# Patient Record
Sex: Female | Born: 1960 | Hispanic: No | Marital: Married | Smoking: Never smoker
Health system: Southern US, Community
[De-identification: ages and names within clinical notes are randomized; demographics above are authoritative.]

## PROBLEM LIST (undated history)

## (undated) DIAGNOSIS — R569 Unspecified convulsions: Secondary | ICD-10-CM

## (undated) DIAGNOSIS — F419 Anxiety disorder, unspecified: Secondary | ICD-10-CM

## (undated) DIAGNOSIS — I10 Essential (primary) hypertension: Secondary | ICD-10-CM

## (undated) HISTORY — PX: APPENDECTOMY: SHX54

## (undated) HISTORY — PX: ABDOMINAL HYSTERECTOMY: SHX81

## (undated) HISTORY — PX: CHOLECYSTECTOMY: SHX55

## (undated) HISTORY — PX: OTHER SURGICAL HISTORY: SHX169

---

## 2005-03-15 ENCOUNTER — Emergency Department: Payer: Self-pay | Admitting: Unknown Physician Specialty

## 2006-07-05 ENCOUNTER — Ambulatory Visit: Payer: Self-pay

## 2007-08-16 ENCOUNTER — Ambulatory Visit: Payer: Self-pay

## 2008-12-02 ENCOUNTER — Ambulatory Visit: Payer: Self-pay

## 2009-09-17 ENCOUNTER — Ambulatory Visit: Payer: Self-pay | Admitting: Internal Medicine

## 2009-12-28 ENCOUNTER — Encounter: Payer: Self-pay | Admitting: Cardiovascular Disease

## 2010-03-29 ENCOUNTER — Ambulatory Visit: Payer: Self-pay | Admitting: Family Medicine

## 2010-10-15 ENCOUNTER — Ambulatory Visit: Payer: Self-pay | Admitting: Family Medicine

## 2010-12-03 ENCOUNTER — Encounter: Payer: Self-pay | Admitting: Cardiovascular Disease

## 2010-12-03 ENCOUNTER — Emergency Department: Payer: Self-pay | Admitting: Emergency Medicine

## 2010-12-09 ENCOUNTER — Encounter: Payer: Self-pay | Admitting: Cardiovascular Disease

## 2010-12-09 DIAGNOSIS — R079 Chest pain, unspecified: Secondary | ICD-10-CM | POA: Insufficient documentation

## 2010-12-15 ENCOUNTER — Telehealth (INDEPENDENT_AMBULATORY_CARE_PROVIDER_SITE_OTHER): Payer: Self-pay | Admitting: *Deleted

## 2010-12-23 NOTE — Miscellaneous (Signed)
Summary: Treadmill Order  Clinical Lists Changes  Problems: Added new problem of CHEST PAIN UNSPECIFIED (ICD-786.50) Orders: Added new Referral order of Treadmill (Treadmill) - Signed

## 2010-12-23 NOTE — Progress Notes (Signed)
Summary: Called pt  Phone Note Outgoing Call Call back at [phone]   Call placed by: Harlon Flor,  December 15, 2010 9:31 AM Call placed to: Patient Summary of Call: LMOM TCB to schedule Treadmill.  Hospital f/u was on the bump list.  Attempting to schedule that as well. Initial call taken by: Harlon Flor,  December 15, 2010 9:32 AM

## 2010-12-29 ENCOUNTER — Encounter: Payer: Self-pay | Admitting: Cardiovascular Disease

## 2010-12-29 ENCOUNTER — Encounter (INDEPENDENT_AMBULATORY_CARE_PROVIDER_SITE_OTHER): Payer: BC Managed Care – PPO

## 2010-12-29 ENCOUNTER — Ambulatory Visit (INDEPENDENT_AMBULATORY_CARE_PROVIDER_SITE_OTHER): Payer: BC Managed Care – PPO | Admitting: Cardiovascular Disease

## 2010-12-29 DIAGNOSIS — I10 Essential (primary) hypertension: Secondary | ICD-10-CM

## 2010-12-29 DIAGNOSIS — R5383 Other fatigue: Secondary | ICD-10-CM

## 2010-12-29 DIAGNOSIS — R079 Chest pain, unspecified: Secondary | ICD-10-CM

## 2010-12-29 DIAGNOSIS — R5381 Other malaise: Secondary | ICD-10-CM

## 2011-01-06 NOTE — Assessment & Plan Note (Signed)
Summary: NP/HOSPITAL F/U/SAB   Visit Type:  Initial Consult Primary Provider:  Dr. Beckey Downing  CC:  F/U [facility].  Has trouble with nerves in legs.  Occas. has chest pain at times..  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 She presents for further evaluation.  She developed acute onset of weakness while at work December 03 2010. She went to the nurse recorded a blood pressure systolic 190. She went to her primary care physician, Dr. Beckey Downing, who suggested she go to the emergency room.in the emergency room, she was kept for numerous hours on telemetry. Lab work was essentially benign with no elevated cardiac enzymes, EKG was also benign showing normal sinus rhythm with rate of 72 beats per minute, no significant ST or T wave changes. CBC, BMP was normal. She was discharged to home with followup in our clinic.  Since then she has had no further episode of weakness. She typically is active, does some exercise though not on a regular basis. She is never had episodes like this before. She denies any chest pain or shortness of breath.blood pressure has been well-controlled at home.   Resting EKG shows normal sinus rhythm with rate of 100 beats a minute, rare PVC, no significant ST or T wave changes  Preventive Screening-Counseling & Management  Alcohol-Tobacco     Smoking Status: never  Caffeine-Diet-Exercise     Does Patient Exercise: yes  Current Medications (verified): 1)  Centrum  Tabs (Multiple Vitamins-Minerals) .... One Tablet Once Daily 2)  Vitamin C  Allergies (verified): No Known Drug Allergies  Past History:  Family History: Last updated: 12/29/2010 Father: Living; palpitiations Mother:Living; unknown  Social History: Last updated: 12/29/2010 Married  Tobacco Use - No.  Alcohol Use - yes--occas. Regular Exercise -  yes--30 minutes once daily  Risk Factors: Exercise: yes (12/29/2010)  Risk Factors: Smoking Status: never (12/29/2010)  Past Surgical History: None  Family History: Father: Living; palpitiations Mother:Living; unknown  Social History: Married  Tobacco Use - No.  Alcohol Use - yes--occas. Regular Exercise - yes--30 minutes once daily  Smoking Status:  never Does Patient Exercise:  yes  Review of Systems  The patient denies fever, weight loss, weight gain, vision loss, decreased hearing, hoarseness, chest pain, syncope, dyspnea on exertion, peripheral edema, prolonged cough, abdominal pain, incontinence, muscle weakness, depression, and enlarged lymph nodes.         weakness, hypertension  Vital Signs:  Patient profile:   50 year old female Height:      65 inches Weight:      166 pounds BMI:     27.72 Pulse rate:   97 / minute BP sitting:   110 / 80  (left arm) Cuff size:   regular  Vitals Entered By: Bishop Dublin, CMA (December 29, 2010 2:17 PM)  Physical Exam  General:  Well developed, well nourished, in no acute distress. Head:  normocephalic and atraumatic Neck:  Neck supple, no JVD. No masses, thyromegaly or abnormal cervical nodes. Lungs:  Clear bilaterally to auscultation and percussion. Heart:  Non-displaced PMI, chest non-tender; regular rate and rhythm, S1, S2 without murmurs, rubs or gallops. Carotid upstroke normal, no bruit.  Pedals normal pulses. No edema, no varicosities. Abdomen:  Bowel sounds positive; abdomen soft and non-tender without masses Msk:  Back normal, normal gait. Muscle strength and tone normal. Pulses:  pulses normal in  all 4 extremities Extremities:  No clubbing or cyanosis. Neurologic:  Alert and oriented x 3. Skin:  Intact without lesions or rashes. Psych:  Normal affect.   Impression & Recommendations:  Problem # 1:  FATIGUE / MALAISE (ICD-780.79) etiology of her weakness and malaise is uncertain. She has not had further  episodes. She does not have a significant family history of coronary artery disease. She is concerned that it could be her heart. We will set her up for a exercise treadmill test on today's visit.  Problem # 2:  HYPERTENSION, BENIGN (ICD-401.1) blood pressure on January 13 was severely elevated. Since then it has been well-controlled. We will continue to monitor this for now. It could have been elevated in the setting of her profound weakness and malaise of uncertain etiology.  Appended Document: NP/HOSPITAL F/U/SAB treadmill study was performed: Resting EKG shows normal sinus rhythm with rate 103 beats per minute, rare PVC, no significant ST or T wave changes.  She was exercised on a regular Bruce protocol. Resting blood pressure was 110/80. She was able to exert herself without symptoms of shortness of breath, weakness or chest pain. She exercised for a total of 7 minutes and 45 seconds and stopped secondary to fatigue. She did achieve 10.1 METS. No significant EKG changes concerning for ischemia. Peak blood pressure was 140/86, maximum heart rate was 158 beats per minute, 92% of her maximum heart rate achieved.  Maximum heart rate times blood pressure equaled 18,480 After 3 minutes in recovery, her blood pressure is 110/80. Peak ST depression at peak exercise was  0.9 m.  conclusion: This is a negative exercise treadmill study with no evidence of ischemia appeared. Etiology of her weakness is likely noncardiac given these treadmill findings. We have asked her to contact us if she has further symptoms.

## 2011-06-20 ENCOUNTER — Encounter: Payer: Self-pay | Admitting: Cardiovascular Disease

## 2011-10-05 ENCOUNTER — Ambulatory Visit: Payer: Self-pay | Admitting: Family Medicine

## 2012-09-30 ENCOUNTER — Ambulatory Visit: Payer: Self-pay | Admitting: Internal Medicine

## 2012-09-30 ENCOUNTER — Inpatient Hospital Stay: Payer: Self-pay | Admitting: Internal Medicine

## 2012-09-30 LAB — COMPREHENSIVE METABOLIC PANEL
Albumin: 4.3 g/dL (ref 3.4–5.0)
Anion Gap: 7 (ref 7–16)
Calcium, Total: 9.6 mg/dL (ref 8.5–10.1)
Creatinine: 0.67 mg/dL (ref 0.60–1.30)
EGFR (African American): >60
EGFR (Non-African Amer.): >60
Glucose: 84 mg/dL (ref 65–99)
Potassium: 4.1 mmol/L (ref 3.5–5.1)
Sodium: 141 mmol/L (ref 136–145)

## 2012-09-30 LAB — CBC
HCT: 42.7 % (ref 35.0–47.0)
HGB: 14.7 g/dL (ref 12.0–16.0)
MCH: 31.9 pg (ref 26.0–34.0)
MCHC: 34.6 g/dL (ref 32.0–36.0)
RBC: 4.63 10*6/uL (ref 3.80–5.20)

## 2012-09-30 LAB — DRUG SCREEN, URINE
Amphetamines, Ur Screen: NEGATIVE (ref ?–1000)
Barbiturates, Ur Screen: NEGATIVE (ref ?–200)
Cocaine Metabolite,Ur ~~LOC~~: NEGATIVE (ref ?–300)
MDMA (Ecstasy)Ur Screen: NEGATIVE (ref ?–500)
Opiate, Ur Screen: NEGATIVE (ref ?–300)
Phencyclidine (PCP) Ur S: NEGATIVE (ref ?–25)
Tricyclic, Ur Screen: NEGATIVE (ref ?–1000)

## 2012-09-30 LAB — PROTIME-INR
INR: 0.8
Prothrombin Time: 11.9 secs (ref 11.5–14.7)

## 2012-09-30 LAB — APTT: Activated PTT: 26.3 secs (ref 23.6–35.9)

## 2012-09-30 LAB — URINALYSIS, COMPLETE
Bacteria: NONE SEEN
Leukocyte Esterase: NEGATIVE
Squamous Epithelial: NONE SEEN

## 2012-10-01 LAB — CBC WITH DIFFERENTIAL/PLATELET
Basophil %: 0.3 %
Eosinophil %: 0 %
HGB: 12.7 g/dL (ref 12.0–16.0)
Lymphocyte %: 6.6 %
MCV: 93 fL (ref 80–100)
Monocyte %: 2.5 %
Neutrophil #: 10.5 10*3/uL — ABNORMAL HIGH (ref 1.4–6.5)
Neutrophil %: 90.6 %
Platelet: 213 10*3/uL (ref 150–440)
RBC: 4.04 10*6/uL (ref 3.80–5.20)
RDW: 12.8 % (ref 11.5–14.5)
WBC: 11.6 10*3/uL — ABNORMAL HIGH (ref 3.6–11.0)

## 2012-10-01 LAB — PHENYTOIN LEVEL, TOTAL
Dilantin: 11.1 ug/mL (ref 10.0–20.0)
Dilantin: 12.4 ug/mL (ref 10.0–20.0)

## 2012-10-01 LAB — COMPREHENSIVE METABOLIC PANEL
Alkaline Phosphatase: 81 U/L (ref 50–136)
BUN: 10 mg/dL (ref 7–18)
Bilirubin,Total: 0.4 mg/dL (ref 0.2–1.0)
Calcium, Total: 8.3 mg/dL — ABNORMAL LOW (ref 8.5–10.1)
Co2: 21 mmol/L (ref 21–32)
Creatinine: 0.61 mg/dL (ref 0.60–1.30)
Osmolality: 287 (ref 275–301)
Potassium: 3.7 mmol/L (ref 3.5–5.1)
SGPT (ALT): 43 U/L (ref 12–78)
Sodium: 143 mmol/L (ref 136–145)
Total Protein: 6.6 g/dL (ref 6.4–8.2)

## 2012-10-01 LAB — MAGNESIUM: Magnesium: 1.6 mg/dL — ABNORMAL LOW

## 2012-10-02 LAB — CBC WITH DIFFERENTIAL/PLATELET
Basophil #: 0.1 10*3/uL (ref 0.0–0.1)
Basophil %: 0.9 %
Eosinophil #: 0 10*3/uL (ref 0.0–0.7)
HCT: 33.1 % — ABNORMAL LOW (ref 35.0–47.0)
HGB: 11.2 g/dL — ABNORMAL LOW (ref 12.0–16.0)
Lymphocyte #: 2 10*3/uL (ref 1.0–3.6)
Lymphocyte %: 24.8 %
MCH: 31 pg (ref 26.0–34.0)
MCHC: 33.7 g/dL (ref 32.0–36.0)
MCV: 92 fL (ref 80–100)
Monocyte #: 0.4 x10 3/mm (ref 0.2–0.9)
Neutrophil %: 68.7 %
RBC: 3.6 10*6/uL — ABNORMAL LOW (ref 3.80–5.20)
RDW: 13.1 % (ref 11.5–14.5)
WBC: 8 10*3/uL (ref 3.6–11.0)

## 2012-10-02 LAB — BASIC METABOLIC PANEL
BUN: 11 mg/dL (ref 7–18)
Calcium, Total: 7.8 mg/dL — ABNORMAL LOW (ref 8.5–10.1)
Chloride: 111 mmol/L — ABNORMAL HIGH (ref 98–107)
Creatinine: 0.52 mg/dL — ABNORMAL LOW (ref 0.60–1.30)
EGFR (Non-African Amer.): >60
Osmolality: 287 (ref 275–301)
Potassium: 3.6 mmol/L (ref 3.5–5.1)
Sodium: 145 mmol/L (ref 136–145)

## 2012-10-02 LAB — URINE CULTURE

## 2012-10-03 LAB — CBC WITH DIFFERENTIAL/PLATELET
Basophil #: 0 10*3/uL (ref 0.0–0.1)
Eosinophil #: 0.1 10*3/uL (ref 0.0–0.7)
Lymphocyte #: 1.5 10*3/uL (ref 1.0–3.6)
Lymphocyte %: 24.9 %
MCH: 32.4 pg (ref 26.0–34.0)
MCV: 92 fL (ref 80–100)
Monocyte #: 0.5 x10 3/mm (ref 0.2–0.9)
Neutrophil #: 3.9 10*3/uL (ref 1.4–6.5)
Neutrophil %: 64.5 %
Platelet: 140 10*3/uL — ABNORMAL LOW (ref 150–440)
RDW: 12.7 % (ref 11.5–14.5)

## 2012-10-03 LAB — BASIC METABOLIC PANEL
Anion Gap: 9 (ref 7–16)
BUN: 14 mg/dL (ref 7–18)
Creatinine: 0.49 mg/dL — ABNORMAL LOW (ref 0.60–1.30)
EGFR (African American): >60

## 2012-10-04 LAB — CSF CULTURE W GRAM STAIN

## 2012-10-06 LAB — CULTURE, BLOOD (SINGLE)

## 2012-10-07 LAB — PHENYTOIN LEVEL, TOTAL: Dilantin: 6.1 ug/mL — ABNORMAL LOW (ref 10.0–20.0)

## 2012-10-07 LAB — CREATININE, SERUM: EGFR (Non-African Amer.): >60

## 2012-10-07 LAB — ALBUMIN: Albumin: 3 g/dL — ABNORMAL LOW (ref 3.4–5.0)

## 2012-10-08 LAB — PLATELET COUNT: Platelet: 200 10*3/uL (ref 150–440)

## 2012-10-15 LAB — COMPREHENSIVE METABOLIC PANEL
Albumin: 3.7 g/dL (ref 3.4–5.0)
Alkaline Phosphatase: 136 U/L (ref 50–136)
Anion Gap: 8 (ref 7–16)
BUN: 9 mg/dL (ref 7–18)
Bilirubin,Total: 0.2 mg/dL (ref 0.2–1.0)
Chloride: 105 mmol/L (ref 98–107)
Glucose: 88 mg/dL (ref 65–99)
Potassium: 3.7 mmol/L (ref 3.5–5.1)
SGOT(AST): 33 U/L (ref 15–37)
SGPT (ALT): 61 U/L (ref 12–78)
Total Protein: 7.4 g/dL (ref 6.4–8.2)

## 2012-10-15 LAB — CBC WITH DIFFERENTIAL/PLATELET
Basophil #: 0.1 10*3/uL (ref 0.0–0.1)
Basophil %: 1.1 %
Eosinophil %: 0.5 %
HCT: 37.6 % (ref 35.0–47.0)
HGB: 12.6 g/dL (ref 12.0–16.0)
Lymphocyte #: 1.8 10*3/uL (ref 1.0–3.6)
MCH: 31.1 pg (ref 26.0–34.0)
MCV: 93 fL (ref 80–100)
Monocyte %: 7.8 %
Neutrophil #: 7.2 10*3/uL — ABNORMAL HIGH (ref 1.4–6.5)
RBC: 4.06 10*6/uL (ref 3.80–5.20)
WBC: 10 10*3/uL (ref 3.6–11.0)

## 2012-10-15 LAB — PHENYTOIN LEVEL, TOTAL: Dilantin: 6.9 ug/mL — ABNORMAL LOW (ref 10.0–20.0)

## 2012-10-16 ENCOUNTER — Observation Stay: Payer: Self-pay | Admitting: Internal Medicine

## 2012-10-16 LAB — URINALYSIS, COMPLETE
Blood: NEGATIVE
Glucose,UR: NEGATIVE mg/dL (ref 0–75)
Nitrite: NEGATIVE
Ph: 7 (ref 4.5–8.0)
Protein: NEGATIVE
Specific Gravity: 1.011 (ref 1.003–1.030)
Squamous Epithelial: 2
WBC UR: 1 /HPF (ref 0–5)

## 2012-10-16 LAB — DRUG SCREEN, URINE
Benzodiazepine, Ur Scrn: NEGATIVE (ref ?–200)
Cocaine Metabolite,Ur ~~LOC~~: NEGATIVE (ref ?–300)
Methadone, Ur Screen: NEGATIVE (ref ?–300)
Opiate, Ur Screen: NEGATIVE (ref ?–300)
Phencyclidine (PCP) Ur S: NEGATIVE (ref ?–25)

## 2012-10-17 LAB — PHENYTOIN LEVEL, TOTAL: Dilantin: 8.2 ug/mL — ABNORMAL LOW (ref 10.0–20.0)

## 2014-02-28 ENCOUNTER — Emergency Department: Payer: Self-pay | Admitting: Emergency Medicine

## 2014-02-28 LAB — CBC WITH DIFFERENTIAL/PLATELET
BASOS PCT: 0.2 %
Basophil #: 0 10*3/uL (ref 0.0–0.1)
EOS PCT: 0.1 %
Eosinophil #: 0 10*3/uL (ref 0.0–0.7)
HCT: 40.3 % (ref 35.0–47.0)
HGB: 13.1 g/dL (ref 12.0–16.0)
LYMPHS PCT: 12.2 %
Lymphocyte #: 0.8 10*3/uL — ABNORMAL LOW (ref 1.0–3.6)
MCH: 29.2 pg (ref 26.0–34.0)
MCHC: 32.4 g/dL (ref 32.0–36.0)
MCV: 90 fL (ref 80–100)
Monocyte #: 0.3 x10 3/mm (ref 0.2–0.9)
Monocyte %: 4 %
NEUTROS ABS: 5.5 10*3/uL (ref 1.4–6.5)
Neutrophil %: 83.5 %
Platelet: 180 10*3/uL (ref 150–440)
RBC: 4.47 10*6/uL (ref 3.80–5.20)
RDW: 13.8 % (ref 11.5–14.5)
WBC: 6.6 10*3/uL (ref 3.6–11.0)

## 2014-02-28 LAB — URINALYSIS, COMPLETE
Bacteria: NONE SEEN
Bilirubin,UR: NEGATIVE
Blood: NEGATIVE
Glucose,UR: NEGATIVE mg/dL (ref 0–75)
KETONE: NEGATIVE
LEUKOCYTE ESTERASE: NEGATIVE
NITRITE: NEGATIVE
Ph: 6 (ref 4.5–8.0)
Protein: NEGATIVE
SPECIFIC GRAVITY: 1.004 (ref 1.003–1.030)
Squamous Epithelial: 7

## 2014-02-28 LAB — COMPREHENSIVE METABOLIC PANEL
ALK PHOS: 116 U/L
Albumin: 3.5 g/dL (ref 3.4–5.0)
Anion Gap: 6 — ABNORMAL LOW (ref 7–16)
BUN: 8 mg/dL (ref 7–18)
Bilirubin,Total: 0.6 mg/dL (ref 0.2–1.0)
CO2: 24 mmol/L (ref 21–32)
CREATININE: 0.59 mg/dL — AB (ref 0.60–1.30)
Calcium, Total: 7.8 mg/dL — ABNORMAL LOW (ref 8.5–10.1)
Chloride: 105 mmol/L (ref 98–107)
EGFR (African American): >60
GLUCOSE: 95 mg/dL (ref 65–99)
Osmolality: 268 (ref 275–301)
Potassium: 3.4 mmol/L — ABNORMAL LOW (ref 3.5–5.1)
SGOT(AST): 24 U/L (ref 15–37)
SGPT (ALT): 40 U/L (ref 12–78)
Sodium: 135 mmol/L — ABNORMAL LOW (ref 136–145)
Total Protein: 7.2 g/dL (ref 6.4–8.2)

## 2014-02-28 LAB — LIPASE, BLOOD: Lipase: 100 U/L (ref 73–393)

## 2014-02-28 LAB — TROPONIN I

## 2014-03-17 ENCOUNTER — Ambulatory Visit: Payer: Self-pay | Admitting: Surgery

## 2014-03-18 LAB — PATHOLOGY REPORT

## 2015-03-10 NOTE — Consult Note (Signed)
Brief Consult Note: Diagnosis: seizure.   Patient was seen by consultant.   Consult note dictated.   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 - Pt should get spell characterization done with continuous video EEG monitoring (not available at [facility]). - Pt [facility] be [facility] (if they don't have V. EEG monitoring unit) - pt can go to one of the tertiary care hospitals [place]([facility] also needs MRI brain with and without epilepsy protocol. - Conti dilantin for now (family is OK discontinuing once she is being monitored under EEG). - Spent 75 min with pt and frustrated family, more than 50% of the time was for counselling. - will follow when [facility] is [facility]..  Electronic Signatures: Jolene ProvostShah, Nyrah Demos Kalpeshkumar (MD)  (Signed [phone]-Nov-13 17:28)  Authored: Brief Consult Note   Last Updated: 26-Nov-13 17:28 by Jolene ProvostShah, Bow Buntyn Kalpeshkumar (MD)

## 2015-03-10 NOTE — Consult Note (Signed)
Referring Physician:  Vivien Presto :   Primary Care Physician:  Vivien Presto : [place], [facility], [place], [place], [place] [address], [place] [id number]  Reason for Consult:  Admit Date: 01-Oct-2012   Chief Complaint: seizure like activity   Reason for Consult: new onset seizure   History of Present Illness:  History of Present Illness:   54 yo RHD M presents to hospital after husband finds her down in the bathroom.  Apparently pt woke up confused and was thinking that her old husband was the current husband and thought that her adult children were kids.  She then went into the bathroom and husband heard a thud and found her unresponsive.  There was apparently 3 seizures noted so she was intubated for airway protection and started on Propofol.  There were no further seizure activity on propofol.  Hx all obtained from chart and nursing.  ROS:   Review of Systems   unobtainable secondary to intubation  Past Medical/Surgical Hx:  hyperlipidemia:   hypertension:   migraine:   Denies medical history:   Hysterectomy - Partial:   Past Medical/ Surgical Hx:   Past Medical History as above    Past Surgical History as above   Home Medications: Medication Instructions Last Modified Date/Time  predniSONE 20 mg oral tablet 2 tab(s) PO once a day x 4 days  10-Nov-13 15:11  lisinopril 10 mg oral tablet 1 tab(s) orally once a day 10-Nov-13 15:11  citalopram 20 mg oral tablet 1 tab(s) orally once a day 10-Nov-13 15:11  Vitamin D2 50,000 intl units (1.25 mg) oral capsule 1 cap(s) orally once a week for 8 weeks, then take 1 cap orally every other week. 10-Nov-13 15:11   Allergies:  No Known Allergies:   Allergies:   Allergies NKDA   Social/Family History:  Employment Status: currently employed   Lives With: children; spouse   Living Arrangements: house   Social History: no tob, no EtOH, no illicits   Family History: n/c   Vital  Signs: **Vital Signs.:   11-Nov-13 08:00   Pulse Pulse 88   Respirations Respirations 12   Systolic BP Systolic BP 84   Diastolic BP (mmHg) Diastolic BP (mmHg) 36   Mean BP 61   Pulse Ox % Pulse Ox % 98   Pulse Ox Activity Level  At rest   Oxygen Delivery Ventilator Assisted   Pulse Ox Heart Rate 88   Physical Exam:  General: slightly overweight, appears older than written age, mild distress   HEENT: normocephalic, sclera nonicteric, oropharynx clear   Neck: supple, no JVD, no bruits   Chest: CTA B, no wheezes, good movement;  there is a petechial rash on chest   Cardiac: RRR, no murmurs, no edema, 2+ pulses   Extremities: no C/C/E, FROM   Neurologic Exam:  Mental Status: intubated, sedated, does not open, weakly withdrawals but does not localize, GCS 6T   Cranial Nerves: PERRLAat 33m B, intact Dolls, intact corneals and cough   Motor Exam: weakly withdrawals equally, flaccid tone,  no abnormal movements   Deep Tendon Reflexes: 1+/4 B, downgoing plantars B   Sensory Exam: weaky withdrawals to pain   Coordination: untestable   Lab Results: Hepatic:  11-Nov-13 02:45    Bilirubin, Total 0.4   Alkaline Phosphatase 81   SGPT (ALT) 43   SGOT (AST) 26   Total Protein, Serum 6.6   Albumin, Serum  3.3  TDMs:  11-Nov-13 02:45  Dilantin, Serum 11.1 (Result(s) reported on 01 Oct 2012 at 03:35AM.)  Routine Micro:  10-Nov-13 17:00    Micro Text Report BLOOD CULTURE   COMMENT                   NO GROWTH IN 8-12 HOURS   ANTIBIOTIC                        Culture Comment NO GROWTH IN 8-12 HOURS  Result(s) reported on 01 Oct 2012 at 07:38AM.  General Ref:  10-Nov-13 16:46    Cortisol, Serum RIA ========== TEST NAME ==========  ========= RESULTS =========  = REFERENCE RANGE =  CORTISOL  Cortisol Cortisol                        [   5.5 ug/dL            ]          2.3-19.4                                        Cortisol AM         6.2 - 19.4                                         Cortisol PM         2.3 - 11.9               [facility]            No: [id number]           [address], [place], [place] [address]           Lindon Romp, MD         [phone]   Result(s) reported on 01 Oct 2012 at 01:47AM.   Prolactin, Serum ========== TEST NAME ==========  ========= RESULTS =========  = REFERENCE RANGE =  PROLACTIN  Prolactin Prolactin                       [   23.0 ng/mL           ]          4.8-23.3               [facility]            No: [id number]          [address] [place], [place] [address]           Lindon Romp, MD         [phone]   Result(s) reported on 01 Oct 2012 at 01:47AM.   Acetaminophen, Serum  2 (10-30 POTENTIALLY TOXIC:  > 200 mcg/mL  > 50 mcg/mL at 12 hr after  ingestion  > 300 mcg/mL at 4 hr after  ingestion)   Salicylates, Serum < 1.7 (0.0-2.8 Therapeutic 2.8-20.0 mg/dL Toxic >30.0 mg/dL)  Routine Chem:  10-Nov-13 16:46    LDH, Serum 235 (Result(s) reported on 30 Sep 2012 at 05:49PM.)   Ammonia, Plasma < 25 (Result(s) reported on 30 Sep 2012 at 06:06PM.)  11-Nov-13 02:45    Glucose, Serum  156   BUN 10   Creatinine (  comp) 0.61   Sodium, Serum 143   Potassium, Serum 3.7   Chloride, Serum  111   CO2, Serum 21   Calcium (Total), Serum  8.3   Osmolality (calc) 287   eGFR (African American) >60   eGFR (Non-African American) >60 (eGFR values <72m/min/1.73 m2 may be an indication of chronic kidney disease (CKD). Calculated eGFR is useful in patients with stable renal function. The eGFR calculation will not be reliable in acutely ill patients when serum creatinine is changing rapidly. It is not useful in  patients on dialysis. The eGFR calculation may not be applicable to patients at the low and high extremes of body sizes, pregnant women, and vegetarians.)   Anion Gap 11   Magnesium, Serum  1.6 (1.8-2.4 THERAPEUTIC RANGE: 4-7 mg/dL TOXIC: > 10 mg/dL  -----------------------)   Urine Drugs:  [id number]   Tricyclic Antidepressant, Ur Qual (comp) NEGATIVE (Result(s) reported on 30 Sep 2012 at 03:35PM.)   Amphetamines, Urine Qual. NEGATIVE   MDMA, Urine Qual. NEGATIVE   Cocaine Metabolite, Urine Qual. NEGATIVE   Opiate, Urine qual NEGATIVE   Phencyclidine, Urine Qual. NEGATIVE   Cannabinoid, Urine Qual. NEGATIVE   Barbiturates, Urine Qual. NEGATIVE   Benzodiazepine, Urine Qual. POSITIVE (----------------- The URINE DRUG SCREEN provides only a preliminary, unconfirmed analytical test result and should not be used for non-medical  purposes.  Clinical consideration and professional judgment should be  applied to any positive drug screen result due to possible interfering substances.  A more specific alternate chemical method must be used in order to obtain a confirmed analytical result.  Gas chromatography/mass spectrometry (GC/MS) is the preferred confirmatory method.)   Methadone, Urine Qual. NEGATIVE  Cardiac:  10-Nov-13 16:46    CK, Total 78 (Result(s) reported on 30 Sep 2012 at 05:56PM.)  Routine UA:  10-Nov-13 15:06    Color (UA) Straw   Clarity (UA) Clear   Glucose (UA) Negative   Bilirubin (UA) Negative   Ketones (UA) Negative   Specific Gravity (UA) 1.008   Blood (UA) Negative   pH (UA) 7.0   Protein (UA) Negative   Nitrite (UA) Negative   Leukocyte Esterase (UA) Negative (Result(s) reported on 30 Sep 2012 at 03:39PM.)   RBC (UA) 1 /HPF   WBC (UA) NONE SEEN   Bacteria (UA) NONE SEEN   Epithelial Cells (UA) NONE SEEN   Mucous (UA) PRESENT (Result(s) reported on 30 Sep 2012 at 03:39PM.)  Routine Coag:  10-Nov-13 12:59    Activated PTT (APTT) 26.3 (A HCT value >55% may artifactually increase the APTT. In one study, the increase was an average of 19%. Reference: "Effect on Routine and Special Coagulation Testing Values of Citrate Anticoagulant Adjustment in Patients with High HCT Values." American Journal of Clinical Pathology  2006;126:400-405.)   Prothrombin 11.9   INR 0.8 (INR reference interval applies to patients on anticoagulant therapy. A single INR therapeutic range for coumarins is not optimal for all indications; however, the suggested range for most indications is 2.0 - 3.0. Exceptions to the INR Reference Range may include: Prosthetic heart valves, acute myocardial infarction, prevention of myocardial infarction, and combinations of aspirin and anticoagulant. The need for a higher or lower target INR must be assessed individually. Reference: The Pharmacology and Management of the Vitamin K  antagonists: the seventh ACCP Conference on Antithrombotic and Thrombolytic Therapy. CGNFAO.1308Sept:126 (3suppl): [id number] A HCT value >55% may artifactually increase the PT.  In one study,  the increase was an average of 25%.  Reference:  "Effect on Routine and Special Coagulation Testing Values of Citrate Anticoagulant Adjustment in Patients with High HCT Values." American Journal of Clinical Pathology 2006;126:400-405.)  Routine Hem:  11-Nov-13 02:45    WBC (CBC)  11.6   RBC (CBC) 4.04   Hemoglobin (CBC) 12.7   Hematocrit (CBC) 37.3   Platelet Count (CBC) 213   MCV 93   MCH 31.4   MCHC 34.0   RDW 12.8   Neutrophil % 90.6   Lymphocyte % 6.6   Monocyte % 2.5   Eosinophil % 0.0   Basophil % 0.3   Neutrophil #  10.5   Lymphocyte #  0.8   Monocyte # 0.3   Eosinophil # 0.0   Basophil # 0.0 (Result(s) reported on 01 Oct 2012 at 03:22AM.)   Radiology Results: CT:    10-Nov-13 13:54, CT Head Without Contrast   CT Head Without Contrast    REASON FOR EXAM:    HEAD INJURY AMS  COMMENTS:       PROCEDURE: CT  - CT HEAD WITHOUT CONTRAST  - Sep 30 2012  1:54PM     RESULT:Comparison:  None    Technique: Multiple axial images from the foramen magnum to the vertex   were obtained without IV contrast.    Findings:      There is no evidence of mass effect, midline shift, or extra-axial fluid    collections.  There is no evidence of a space-occupying lesion or   intracranial hemorrhage. There is no evidence of a cortical-based area of     acute infarction.      The ventricles and sulci are appropriate for the patient's age. The basal   cisterns are patent.    Visualized portions of the orbits are unremarkable. The visualized   portions of the paranasal sinuses and mastoid air cells are unremarkable.     The osseous structures are unremarkable.    IMPRESSION:      No acute intracranial process.      Dictation Site: 1          Verified By: Jennette Banker, M.D., MD   Radiology Impression:  Radiology Impression: CT head personally reviewed by me and is completely normal   Impression/Recommendations:  Recommendations:   labs reviewed and remarkable for leukocytosis from other providers reviewed d/w with referring physician   Status epilepticus-  this is a life threatening condition and it appears that pt may be exiting the acute phase and going into a post-ictal stage.  The etiology of this is unknown but there is concern for infectious origin due to leukocytosis and rash.  No focal findings to suggest mass at this time.  This could also be idiopathic.  There are no clinical signs of serotonin syndrome or neuroleptic malignant syndrome.  This is clearly not pseudoseizures. Leukocytosis-  could be a reaction to seizure or pointing to some infectious process Hypertension-  controlled  LP today to r/o CNS infection EEG pending MRI ordered but pt must come off vent to get per nursing check dilantin level  continue dilantin 157m q8h IV wean sedation as tolerated will follow with you minutes of critical care time spent, reviewing chart, examing pt, reviewing images and coordinating care.  Electronic Signatures: SJamison Neighbor(MD)  (Signed 11-Nov-13 08:55)  Authored: REFERRING PHYSICIAN, Primary Care Physician, Consult, History of Present Illness, Review of Systems, PAST  MEDICAL/SURGICAL HISTORY, HOME MEDICATIONS, ALLERGIES, Social/Family History, NURSING VITAL SIGNS, Physical Exam-, LAB RESULTS, RADIOLOGY RESULTS,  Recommendations   Last Updated: 11-Nov-13 08:55 by Jamison Neighbor (MD)

## 2015-03-10 NOTE — Consult Note (Signed)
Brief Consult Note: Diagnosis: Pseudoseizures.   Patient was seen by consultant.   Consult note dictated.   Recommend further assessment or treatment.   Orders entered.   Discussed with Attending MD.   Comments: Met with the patient and her family. She feeks ready to return home. The family agrees. She is now fuly oriented but still does not recall any details about her professional life. She is unable to tell me if she is depresed or not.   PLAN: 1. The patient does not meet criteria for IVC. Please discharge as appropriate.   2. The patient is to continue all medications as prescribed by her neurologist.   3. Please lower Clonanzepam to 0.5 mg at bedtime.   4. Appopintment with [facility] for medication managment is tomorrow.   5. The patient was given contact number for Karen San Marino a local therapist.   6. The patient will hopefully be able to take advantage of short term disability or FLMA before returning to work.   7. I will sign off.  Electronic Signatures: Orson Slick (MD)  (Signed [phone] 15:05)  Authored: Brief Consult Note   Last Updated: 18-Nov-13 15:05 by Orson Slick (MD)

## 2015-03-10 NOTE — Discharge Summary (Signed)
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   SECONDARY DIAGNOSES:  1. Hypertension.  2. Migraine. 3. Depression.  4. Hyperlipidemia.   CONSULTATION:  1. Neurology, Dr. Mellody Drown. 2. Psychiatry, Dr. Kristine Linea.   3. Pulmonary, Dr. Erin Fulling.   LABORATORY, DIAGNOSTIC AND RADIOLOGICAL DATA:  EEG on November 11th by Dr. Mellody Drown showed generalized intermittent slowing. No evidence of epilepsy or seizure. CT scan of the head without contrast on November 10th  showed no acute intracranial process. CT scan of the cervical spine without contrast on November 10th showed no acute osseous injury of the cervical spine. Chest x-ray on November 10th showed no acute disease. Chest x-ray on November 11th showed unchanged x-ray. Chest x-ray on November 12th showed extubation of the trachea and esophagus. Urinalysis on admission was negative. Blood cultures x2 were negative on November 10th. Urine culture was negative on November 10th. CSF culture was negative on November 11th. Serum cortisol level was within normal limits with a value of 16.6 on November 12th. Serum serotonin was within normal limits with a value of 7.0. HSV PCR was negative.   HISTORY AND SHORT HOSPITAL COURSE: The patient is a 54 year old female with the above-mentioned medical problems who was admitted for possible seizure. Please see Dr. Lupe Carney dictated History and Physical for further details. Neurology consultation was obtained with Dr. Mellody Drown who recommended EEG. He also recommended starting antiseizure medication and the patient was started on IV Dilantin. The patient was intubated for respiratory protection Pulmonary consultation was obtained with Dr. Belia Heman, who followed the patient during the course. She was extubated later during the Critical Care Unit stay and  was transferred to the floor. Psychiatric consultation was obtained with Dr. Jennet Maduro, who recommended starting low-dose Klonopin and starting Depakote. She was not felt to be a candidate for involuntary commitment. The patient did have more episodes of possible seizure during the hospital stay, but there was a big concern for having a pseudoseizure and also by Neurology, although considering family concern and she has not been ruled out, Neurology recommended continuing antiseizure medication in the form of Dilantin and valproic acid. She was doing much better on November 18th, and  after a discussion with Psychiatry the patient was discharged home. Family and the patient was in agreement.   PERTINENT DISCHARGE PHYSICAL EXAMINATION:  VITAL SIGNS: On the date of discharge her vital signs were as follows: Temperature 97.8, heart rate 92 per minute, respirations 18 per minute, blood pressure 108/74 mmHg. She was saturating 100% on room air. CARDIOVASCULAR: S1, S2 normal. No murmurs, rubs, or gallops. LUNGS: Clear to auscultation bilaterally. No wheezing, rales, rhonchi, or crepitation. ABDOMEN: Soft, benign. NEUROLOGIC: Nonfocal examination. All other physical examination remained at baseline.   DISCHARGE MEDICATIONS:  1. Vitamin D2 50,000 international units, 1 capsule p.o. once a week for eight weeks, then 1 capsule p.o. every other week.  2. Lisinopril 10 mg p.o. daily.  3. Acetaminophen 650 mg p.o. every four hours as needed.  4. Valproic acid 250 mg p.o. three times a day. 5. Phenytoin 100 mg p.o. every eight hours.  6. Clonazepam 0.5 mg p.o. at bedtime as needed.   DISCHARGE DIET: Regular.   DISCHARGE ACTIVITY: As tolerated.   DISCHARGE INSTRUCTIONS AND FOLLOWUP:   1. The patient was instructed to follow up with her primary care physician, Dr. Sherrine Maples  Willett, in 1 to 2 weeks.  2. She will follow up with [facility] on November 19th as scheduled in the morning around 10 or 10:30 a.m.   3. She was instructed to stop citalopram and prednisone, and depending on her psychiatric follow-up she was recommended to continue her psychiatric medication, what they request.  4. She was also instructed to stay out of work until evaluated by Psychiatry as an outpatient and get evaluated as an outpatient for possible FMLA as she works as a Runner, broadcasting/film/videoteacher.   TOTAL TIME DISCHARGING THIS PATIENT: 55 minutes.  ____________________________ Ellamae SiaVipul S. Sherryll BurgerShah, MD vss:cbb D: 10/09/2012 07:29:22 ET T: 10/09/2012 10:51:50 ET JOB#: [id number]  cc: [place] Basham S. Sherryll BurgerShah, MD, <Dictator> Jorje GuildGlenn R. Beckey DowningWillett, MD [facility] Jolanta B. Jennet MaduroPucilowska, MD Troy SineMatthew C. Katrinka BlazingSmith, MD Dory LarsenKurian D. Kasa, MD Ellamae SiaVIPUL S [facility] MD ELECTRONICALLY SIGNED 10/12/2012 21:24

## 2015-03-10 NOTE — Consult Note (Signed)
Brief Consult Note: Diagnosis: Pseudoseizures.   Consult note dictated.   Recommend further assessment or treatment.   Discussed with Attending MD.   Comments: Attempted to see the patient this pm. She is asleep. Sitter at bedside. She had 2 episodes since return to the floor from CCU.  [name] has no h/o of mental ilnes. She has been under considerable stress at work. Following a fall on Sunday, she developed multiple seizure like episodes with amnesia. She believes that she is 36 yp and children are 5 and 8. She does not recognize her current husband and believes that she is married to her first husband who died of brain tumor.   PLAN: 1. Please continue Depakote.  2. I will return in am.   3. The patient was assessed this am by our nurse and psychologist. They did not feel this would be appropriate admission today.   4. There is no need to admit to psychiatry if symptoms resolve.  Electronic Signatures: Kristine LineaPucilowska, Dusan Lipford (MD)  (Signed [id number]-Nov-13 20:55)  Authored: Brief Consult Note   Last Updated: [id number]-OZH-08: 14-Nov-13 20:55 by Kristine LineaPucilowska, Erland Vivas (MD)

## 2015-03-10 NOTE — H&P (Signed)
PATIENT NAME:  Dana [name], Dana [name] MR#:  [id number] DATE OF BIRTH:  [birth year]  DATE OF ADMISSION:  10/16/2012  REFERRING PHYSICIAN: Dr. Janalyn Harderavid Kaminski    PRIMARY CARE PHYSICIAN: Dr. Barry BrunnerGlenn Willett    CHIEF COMPLAINT: Seizure-like activity.   HISTORY OF PRESENT ILLNESS: This is a 54 year old female with significant past medical history of hypertension, migraine, depression, and hyperlipidemia who was recently discharged from [facility] with diagnosis of seizures versus pseudoseizures. The patient was admitted on 09/30/2012 and discharged on 10/08/2012 where she was discharged home on Dilantin and valproic acid for possible seizures. The patient was brought by her family today for what appeared to be seizure-like activity. The patient this afternoon while laying on the couch started to have some generalized shaking movement with thrashing and shortness of breath as was told by the family which prompted them to bring her to the ED. In the ED the patient had another episode where it was mainly consistent of thrashing. There was no urinary incontinence, no stool incontinence, and no tongue biting as well. The patient had CT of the brain which did not show any acute findings. [name] Dilantin level was 6.9 and her valproic acid was 53. Family reports the patient was compliant with her medication. She only skipped one dose Friday afternoon which was followed that night by one episode of seizure as well. As well, the patient had an episode of visual and auditory hallucination in the ED where she reported to the ED physician she is seeing and hearing a music band in the room but at the time of my evaluation the patient reports she hasn't been seeing or hearing anything and she denies any thoughts of hurting herself or hurting anyone else.   PAST MEDICAL HISTORY:  1. Hypertension.  2. Migraines.  3. Depression.  4. Hyperlipidemia.   5. Recent diagnosis of seizures versus pseudoseizures.   PAST  SURGICAL HISTORY: Hysterectomy.   ALLERGIES: No known drug allergies.   HOME MEDICATIONS:  1. Vitamin D2 50,000 international units once a week.  2. Lisinopril 10 mg oral daily.  3. Valproic acid 250 mg oral 3 times a day.  4. Phenytoin 100 mg oral 3 times a day.  5. Clonazepam 0.5 mg oral at bedtime as needed.   SOCIAL HISTORY: The patient is an Data processing managerassistant teacher. Drinks alcohol occasionally. No drug or tobacco use.   FAMILY HISTORY: No family history of cardiac disease.   REVIEW OF SYSTEMS: The patient is lethargic and confused. She denies any fever, fatigue, or weakness. EYES: Denies blurry vision, double vision, or pain. ENT: Denies tinnitus, ear pain, hearing loss. RESPIRATORY: No cough, wheezing, COPD, or asthma. CARDIOVASCULAR: No chest pain, edema, arrhythmia, palpitation. GI: No nausea, vomiting, diarrhea, or abdominal pain. GU: No dysuria, hematuria, or renal colic. ENDOCRINE: No polyuria, polydipsia, heat or cold intolerance. HEMATOLOGY: No anemia, easy bruising, bleeding diathesis. INTEGUMENTARY: No acne, rash, or skin lesion. MUSCULOSKELETAL: No gout, swelling, arthritis, or cramps. NEUROLOGIC: Had seizure-like activity today. As well, since she left the hospital she still has been using walker for ambulation. No dementia. No ataxia. PSYCH: History of depression and anxiety. No alcohol or history of substance abuse.   PHYSICAL EXAMINATION:   VITAL SIGNS: Temperature 99, pulse 96, respiratory rate 17, blood pressure 98/66, saturating 96% on room air.   GENERAL: Well nourished female sitting comfortably in no apparent distress.   HEENT: Head atraumatic, normocephalic. Pupils equal and reactive to light. Pink conjunctivae. Anicteric sclerae. Moist oral mucosa. Tongue  does not show any laceration or injury.  NECK: Supple. No thyromegaly. No JVD.   CHEST: Good air entry bilaterally. No wheezing, rales, or rhonchi.   CARDIOVASCULAR: S1, S2 heard. No rubs, murmur, or gallops.    ABDOMEN: Soft, nontender, nondistended. Bowel sounds present.   EXTREMITIES: No edema. No clubbing. No cyanosis.   PSYCHIATRIC: The patient is sleepy but opens eyes when asked to and answers questions appropriately but in a sluggish manner which is inconsistent.   NEUROLOGIC: Cranial nerves grossly intact. Motor 5 out of 5 in all extremities.   EXTREMITIES: Normal skin turgor. Warm and dry.   PERTINENT LABS: Glucose 88, BUN 9, creatinine 0.57, sodium 138, potassium 3.7, chloride 105, CO2 25. Magnesium 1.6. Dilantin 6.9. Valproic acid 53. White blood cells 10, hemoglobin 12.6, hematocrit 37.6, platelets 249.  CT of brain without contrast did not show any acute intracranial pathology.   ASSESSMENT AND PLAN: This is a 54 year old female who was recently discharged from [facility] with seizures versus pseudoseizures who presents today with seizure-like activity movement. 1. 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 The patient had multiple episodes today with one episode in the ED. The patient had some inconsistent activity like thrashing. The patient is already on seizure medication and her levels are acceptable. Will continue her home medication at this point. Will continue with valproic acid and Dilantin and will start her on p.r.n. Ativan as needed for seizures and keep her on seizure precautions. Will reconsult Neurology service. As well, will consult Psych service as well to re-evaluate if there is any psych component in her seizures. Will repeat [name] EEG. 2. Visual and auditory hallucinations. The patient does not have any thoughts to harm herself or others. Will consult Psych.  3. Hypertension. Will continue home medication.  4. DVT prophylaxis. Sub-Q heparin.   CODE STATUS: The patient is FULL CODE.      TOTAL TIME SPENT ON ADMISSION AND PATIENT CARE: 55 minutes.   ____________________________ Starleen Arms, MD dse:drc D: 10/16/2012 01:21:21  ET T: 10/16/2012 07:59:38 ET JOB#: [id number]  cc: Starleen Arms, MD, <Dictator> Jorje Guild. Beckey Downing, MD Jadzia Ibsen Teena Irani MD ELECTRONICALLY SIGNED 10/18/2012 0:24

## 2015-03-10 NOTE — Discharge Summary (Signed)
PATIENT NAME:  Dana [name], Dana [name] MR#:  [id number] DATE OF BIRTH:  1961-05-08  DATE OF ADMISSION:  10/16/2012 DATE OF DISCHARGE:  10/17/2012  PRIMARY CARE PHYSICIAN: Dr. Barry BrunnerGlenn Willett   CONSULTATIONS:  1. Neurology, Dr. Sherryll BurgerShah  2. Behavior Medicine physician, Dr. Guss Bundehalla    DISCHARGE DIAGNOSES:  1. 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 2. Hypertension. 3. Hypomagnesemia.  4. Anxiety.   CODE STATUS: FULL CODE.   CONDITION: Stable.   HOME MEDICATIONS:  1. Vitamin D2 50,000 international units one cap once a week for eight weeks and then one cap oral every other week.  2. Clonazepam 0.5 mg p.o. once a day at bedtime p.r.n. for anxiety.  3. Lisinopril 10 mg p.o. daily.  4. Valproic acid 250 mg p.o. capsule one cap 3 times with meals.  5. Phenytoin 100 mg p.o. capsule 1 cap 3 times a day.   DIET: Regular diet.   ACTIVITY: As tolerated.   FOLLOW-UP CARE:  1. Follow-up with PCP within 1 to 2 weeks.  2. Follow-up with Dr. Sherryll BurgerShah within one week for [facility], Dr. Sherlean FootSinha. If the patient has a seizure, [facility] for possible video EEG monitor. 3. Follow-up with Psychiatry as outpatient.    REASON FOR ADMISSION: Seizure-like activity.   HOSPITAL COURSE: The patient is a 54 year old Caucasian female with a history of hypertension, migraines, depression, and hyperlipidemia who presented to the ED with a seizure versus pseudoseizure episode. The patient had one episode of seizure-like activity on November 10th and was discharged on November 18th on Dilantin and valproic acid for possible seizures. For detailed history and physical examination, please refer to the admission note dictated by Dr. Randol KernElgergawy.   HOSPITAL COURSE: On admission date [name] CAT scan of head did not show any intracranial pathology. CBC normal. Dilantin 6.9. Valproic acid 53. The patient was admitted for seizures versus pseudoseizures. The patient has been placed on seizure  precautions, continued on Keppra and Dilantin. Neurology consult was requested. Dr. Sherryll BurgerShah evaluated the patient and discussed with the family member. Since the EEG is normal, he suggested the patient be transferred [facility] for video EEG monitor to rule out pseudoseizure; contacted the [facility] transfer center and on-call neurologist contacted Dr. Sherlean FootSinha, seizure specialist. According to on-call neurologist, Dr. Sherlean FootSinha suggests the patient doesn't need emergent video EEG monitor since the patient had no seizure activity after admission. He suggested to follow-up with Dr. Sherryll BurgerShah for referral [facility]. Discussed [name] situation and [facility] neurologist'[name] recommendations with the patient and [name] and other family member. They agreed to discharge the patient and follow-up with Dr. Sherryll BurgerShah for [facility]. Otherwise, if the patient has any seizure episode, she is to directly [facility] for further evaluation and treatment.   The patient has anxiety. According to Dr. Guss Bundehalla, the patient can follow-up with Psychiatry as outpatient.   [name] hypertension has been controlled with lisinopril.   The patient is clinically stable and will be discharged to home today.   Discussed [name] discharge plan with the patient, [name], other family member, case Production designer, theatre/television/filmmanager, Dr. Sherryll BurgerShah, and [facility] neurologist.      TIME SPENT: About 58 minutes.   ____________________________ Shaune PollackQing Adylynn Hertenstein, MD qc:drc D: 10/17/2012 15:12:30 ET T: 10/18/2012 13:27:57 ET JOB#: [id number]  cc: Shaune PollackQing Genella Bas, MD, <Dictator> Jorje GuildGlenn R. Beckey DowningWillett, MD Shaune PollackQING Donelda Mailhot MD ELECTRONICALLY SIGNED 10/21/2012 9:32
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
## 2015-03-10 NOTE — Consult Note (Signed)
Brief Consult Note: Diagnosis: Pseudoseizures.   Patient was seen by consultant.   Comments: Dana [name] came to the hospital for seizures/pseudoseizures.  I was unable to conduct the interview due to excessive sedation. Wiil attempt tomorrow.  Electronic Signatures: Kristine LineaPucilowska, Rilei Kravitz (MD)  (Signed (862) [phone]-Nov-13 12:48)  Authored: Brief Consult Note   Last Updated: 12-Nov-13 12:48 by Kristine LineaPucilowska, Heydi Swango (MD)

## 2015-03-10 NOTE — Consult Note (Signed)
PATIENT NAME:  Dana [name], Dana [name] MR#:  [id number] DATE OF BIRTH:  29-May-1961  DATE OF CONSULTATION:  10/16/2012  REFERRING PHYSICIAN:   CONSULTING PHYSICIAN:  Jahquan Klugh K. Tristyn Demarest, MD  SUBJECTIVE: The patient was seen in her room along with her sister-in-law who is a very reliable historian. 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 The patient is married for the second time for seven years and lives with her husband in a house and gets along well with him. The patient comes for readmission to [facility] with a chief complaint "pseudoseizures; this is the second time in the past two weeks".   PAST PSYCHIATRIC HISTORY: No previous history of inpatient hospitalization on Psychiatry. No history of suicide attempts. [place] and her last appointment with psychiatrist was 10/12/2012 and  was recommended  to be seen by a therapist and is being treated for anxiety with therapy and was recommended to find a different job which is less stressful and the patient smiled while talking about the same.   MENTAL STATUS EXAMINATION: The patient is dressed in hospital clothes, alert and oriented to place, person, and time. Fully aware of the situation that brought her for admission to [facility]. Affect is neutral. Mood stable. Denies feeling depressed. Denies feeling hopeless or helpless but is worried about seizures that brought her for admission to the hospital. Absolutely denies any ideas or plans to hurt herself or others. Contracts for safety. No evidence of psychosis. Denies auditory or visual hallucinations. Denies any paranoid or suspicious ideas. Cognition is intact. General knowledge and information fair. Abstract interpretation fair. Denies any problems with sleep or appetite but admits that last night she thought that she saw two people singing. This has not happened again so far, just happened last  night when she was trying to go to sleep. Currently she did not have any such problems during the day.   IMPRESSION: Anxiety state, not otherwise specified.       RECOMMENDATION: The patient relates well with her psychiatrist and therapist and recommend follow-up with the same after discharge from the hospital. No other treatment is necessary at this time. The patient can get help on an outpatient basis and can be observed for further such episodes. No other medication needed at this time.   ____________________________ Jannet MantisSurya K. Guss Bundehalla, MD skc:drc D: 10/16/2012 18:55:06 ET T: 10/17/2012 08:48:51 ET JOB#: [id number]  cc: Monika SalkSurya K. Guss Bundehalla, MD, <Dictator> Beau FannySURYA K Aleasha Fregeau MD ELECTRONICALLY SIGNED 10/17/2012 16:04

## 2015-03-10 NOTE — Consult Note (Signed)
PATIENT NAME:  Dana [name], Dana [name] MR#:  [id number] DATE OF BIRTH:  20-Feb-1961  DATE OF CONSULTATION:  10/17/2012  REFERRING PHYSICIAN:   CONSULTING PHYSICIAN:  Dana Turman K. Carlean Crowl, MD  SUBJECTIVE:  The patient was seen in follow-up consultation.  The patient reports that she has been very stable and voices no problems.  The patient is eager to be discharged so that she keep her follow-up appointment with a psychiatrist and a therapist as recommended.  OBJECTIVE:  Adequately dressed and groomed. alert and oriented, pleasant and cooperative.  Mood is stable.  Denies feeling depressed.  Denies feeling hopeless or helpless.  No psychosis.  Cognition is intact.  She is eager to go home and get followup on an outpatient basis.  Contracts for safety.  Insight and judgment improved and better.  IMPRESSION:  Anxiety state not otherwise specified.  RECOMMENDATIONS:  Discharge the patient.  The patient has a follow-up appointment with a psychiatrist and will keep it.      ____________________________ Dana MantisSurya K. Guss Bundehalla, MD skc:bjt D: 10/17/2012 15:58:50 ET T: 10/17/2012 17:01:07 ET JOB#: [id number]  cc: Monika SalkSurya K. Guss Bundehalla, MD, <Dictator> Beau FannySURYA K Naeema Patlan MD ELECTRONICALLY SIGNED 10/18/2012 18:28
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
## 2015-03-10 NOTE — Consult Note (Signed)
Brief Consult Note: Diagnosis: Pseudoseizures.   Patient was seen by consultant.   Consult note dictated.   Recommend further assessment or treatment.   Discussed with Attending MD.   Comments: Met with the patient and her family. She is awake and in the chair eating lunch. She is exhausted. She is unable to participate in programming on psychiatric ward at present. Will reasess patient in the morning.   Ms. Larsen has no h/o of mental ilnes. She has been under considerable stress at work. Following a fall on Sunday, she developed multiple seizure like episodes with amnesia. She believes that she is 54 yo and children are 5 and 8. She does not recognize her current husband and believes that she is married to her first husband who died of brain tumor. She continues to have multiple seizure-like episodes that keep her in bed, asleep most of the time, and unable to engage in therapy.  PLAN: 1. No female beds availalable in Psychiatry today.  2. The patient is not appropriate for our unit at this time.   3. Will continue to follow.  Electronic Signatures: Orson Slick (MD)  (Signed [phone] 13:43)  Authored: Brief Consult Note   Last Updated: 15-Nov-13 13:43 by Orson Slick (MD)

## 2015-03-10 NOTE — Consult Note (Signed)
Brief Consult Note: Diagnosis: Pseudoseizures.   Patient was seen by consultant.   Consult note dictated.   Recommend further assessment or treatment.   Orders entered.   Discussed with Attending MD.   Comments: [name] has no h/o of mental ilnes. She has been under considerable stress at work. Following a fall on Sunday, she developed multiple seizure like episodes with amnesia. She believes that she is 36 yp and children are 5 and 8. She does not recognize her current husband and believes that she is married to her first husband who died of brain tumor.   PLAN: 1. Please continue Depakote.  2. I saw the patient on several occasions today. This pm she seems to recognize her husband finally.  3. Migraine headache. She was given Maxalt that she takes at home.  4. There is no need to admit to psychiatry if symptoms resolve.   5. Will check on her tomorrow pm (no soonner than 5 pm). She could be transfered to psychiatry IF seizures no longer a problem.  Electronic Signatures: Dana [name], Dana [name] (MD)  (Signed [id number]-Nov-13 17:45)  Authored: Brief Consult Note   Last Updated: 13-Nov-13 17:45 by Dana [name], Alegria Dominique (MD)

## 2015-03-10 NOTE — H&P (Signed)
PATIENT NAME:  Dana [name], Dana [name] MR#:  [id number] DATE OF BIRTH:  [birth year]  DATE OF ADMISSION:  09/30/2012  REFERRING PHYSICIAN: Dr. Enedina Finner PRIMARY CARE PHYSICIAN: Dr. Beckey Downing   CHIEF COMPLAINT: Altered mental status.   HISTORY OF PRESENT ILLNESS: Patient is a 54 year old Caucasian female with history of hypertension, hyperlipidemia, migraines and depression who is currently intubated, sedated. Patient is unable to provide review of systems or history but the patient's husband is in the room. Per husband patient was in her usual state of self until this morning. About 9:20 this morning the husband heard a thump in the bathroom and found the patient in a fallen position on her back and unresponsive. It took him a couple of minutes to arouse patient and after this patient was confused and thought that the husband was her previous husband and referred to herself with her prior name. Of note, this is the second husband who has been married to the patient for about seven years. Patient was taken to urgent care center at [facility] where she was evaluated. She had no fever there and vitals were stable. Patient apparently ambulated with the help of the husband to the urgent care center. There collar was placed on her neck and she was advised to come to the ER. Here she had another unresponsive episode and was taken to the ER where she was noted to have 2 or 3 seizure-like activity with shaking of upper and lower extremities. Here she was given several doses of Versed without much benefit and patient ended up being intubated for airway protection. Patient was given vecuronium as well as succinylcholine and propofol for intubation. Patient was successfully intubated. Patient was also noted to have some confusion while in the ER. Postintubation she was noted to have a diffuse rash on the upper torso and was given 50 mg of Benadryl as well as 10 mg of Decadron and the rash has improved. Here she again was noted to  be afebrile and had very normal labs without any leukocytosis or electrolyte abnormalities. CT of the head and C-spine were done which were all negative. Currently she is intubated. She is still thrashing and moving despite being on the Versed and Diprivan drip has been ordered. Hospitalist services were contacted for further evaluation and management. Patient's husband is unaware of any prior seizure activity but in the last seven years there have been no seizures. He did describe wife to have lots of stressors in her life in her job and she was recently started on antidepressant medication, Celexa.   PAST MEDICAL HISTORY:  1. Hypertension.  2. Migraines. 3. Depression.  4. Hyperlipidemia.   PAST SURGICAL HISTORY: Hysterectomy.   ALLERGIES: No known drug allergies.   MEDICATIONS:  1. Citalopram 20 mg 1 tab daily.  2. Lisinopril 10 mg daily.  3. Prednisone 40 mg once a day for four days, unknown why this was recently started. 4. Vitamin D2 50,000 international units once a week for eight weeks.   SOCIAL HISTORY: Patient is remarried to her second husband for the past seven years. Occasional alcohol. No drugs or tobacco. Is an Data processing manager.   FAMILY HISTORY: The husband is unaware of any medical conditions running in the family.   REVIEW OF SYSTEMS: Unable to obtain as patient is intubated and sedated.   PHYSICAL EXAMINATION:  VITAL SIGNS: Temperature here was noted to be 97.6 per nurse which was taken rectally, pulse rate 74, respiratory rate 16, blood pressure initially was 129/74,  and oxygen saturation was 99% on room air. Currently patient is intubated, sedated. Vent setting is as follows: Rate 12, FiO2 of 28%, PEEP of 5, and tidal volume of 500.   GENERAL: Patient is intubated, sedated but patient does move extremities especially upper extremities spontaneously at times.   HEENT: Appears to be normocephalic, atraumatic. Unable to examine the back of the head for any hematomas  given intubated status but appears grossly normal. Pupils are equal and reactive. Anicteric sclerae. Patient is orally intubated.   NECK: Supple. No thyroid enlargement. No cervical lymphadenopathy.   CARDIOVASCULAR: S1, S2 regular rate and rhythm. No murmurs, rubs, or gallops.   LUNGS: Clear to auscultation without wheezing or rhonchi in the anterior fields.   ABDOMEN: Soft, nontender, nondistended. Positive bowel sounds all quadrants.   EXTREMITIES: No significant lower extremity edema.   SKIN: There is diffuse erythematous nonblanching rash above the chest area to the lower neck. No other rashes on the body observed.   NEUROLOGIC: Unable to do full neuro exam given the intubated and sedated status but occasionally moves upper extremities.   PSYCH: Sedated.   LABORATORY, DIAGNOSTIC AND RADIOLOGICAL DATA: Glucose 84, BUN 10, creatinine 0.67, sodium 141, potassium 4.17, CO2 27, anion gap 7, magnesium 2.1. LFTs within normal limits. WBC 8.7, hemoglobin 14.7, hematocrit 42.7, platelets 226, INR 0.8. Initial ABG showing pH of 7.5, pCO2 of 29, and pO2 of 224 on rate of 16, PEEP of 5 on SMV, tidal volume of 500. CT of the head unremarkable. CT of cervical spine no acute osseous injury of the cervical spine and x-ray of the chest no acute disease of the chest. EKG normal sinus rhythm, ST abnormalities which appears to be nonspecific.   ASSESSMENT AND PLAN: We have a 54 year old female with hypertension, hyperlipidemia, migraines and depression who was noted to be unresponsive after a fall earlier in the day who was brought here with several episodes of seizure-like activity, currently intubated and sedated. Patient has no prior history of seizures. She has negative CT of the head and labs look completely unremarkable and patient is afebrile. X-ray of the chest is clear. At this point patient will be admitted to the Critical Care Unit. Patient had no particular complaints prior to this event from today  per husband. At this point we would check a urine toxicology, urinalysis, blood and urine cultures as well as LDH, ammonia and cortisol level. We would also check a prolactin now and again six hours as well as a CPK. Furthermore, we will check an EEG and MRI and put in a neurology consult. I personally discussed the case with Dr. Kemper Durielarke. Patient has been given a gram of fosphenytoin and I would check level in the morning and start her on 100 mg INTRAVENOUS q.8 hours. It is possible patient has a new onset seizures but also given the recent depression and anxiety and stressful job and stressors in life the possibility of pseudoseizures is also there. Patient currently is on Versed and diprivan drip. Patient currently is intubated for airway protection. Would obtain a pulmonary consult and recheck an ABG in about an hour. We would start the patient on Combivent and Flovent. Patient has adequate oxygenation currently. We would hold the Celexa and lisinopril at this point and start the patient on heparin for deep vein thrombosis prophylaxis and await for further neurology input at this point.   CODE STATUS: Patient is a FULL CODE.   TOTAL CRITICAL CARE TIME: 65 minutes.  ____________________________ Krystal Eaton, MD sa:cms D: 09/30/2012 15:46:20 ET T: 10/01/2012 05:27:52 ET JOB#: [id number]  cc: Krystal Eaton, MD, <Dictator> Jorje Guild. Beckey Downing, MD Krystal Eaton MD ELECTRONICALLY SIGNED 11/01/2012 11:03

## 2015-03-14 NOTE — Op Note (Signed)
PATIENT NAME:  Dana [name], Dana [name] MR#:  [id number] DATE OF BIRTH:  01-Jan-1961  DATE OF PROCEDURE:  03/17/2014  PREOPERATIVE DIAGNOSIS: Symptomatic cholelithiasis.   POSTOPERATIVE DIAGNOSIS: Symptomatic cholelithiasis.   PROCEDURE PERFORMED: Robot-assisted laparoscopic cholecystectomy.   SURGEON: Ida Roguehristopher Twylia Oka, MD  PROCTOR: Natale LayMark Bird, MD  ANESTHESIA: General.  ESTIMATED BLOOD LOSS: 10 mL.  COMPLICATIONS: None.   SPECIMENS: Gallbladder.  INDICATION FOR SURGERY: Dana [name] is a pleasant 54 year old female with recurrent, 2 episodes, of right upper quadrant pain and gallstones. The previous pain appears consistent with biliary related pain. She was brought to the operating room suite for cholecystectomy.   DETAILS OF PROCEDURE: After consent was obtained, Dana [name] was brought to the operating room suite. She was induced. Endotracheal tube was placed. General anesthesia was administered. Her abdomen was then prepped and draped in standard surgical fashion. A timeout was then performed correctly identifying the patient name, operative site and procedure to be performed. An infraumbilical incision was made. It was deepened down to the fascia. The fascia was incised. The peritoneum was entered. Two stay sutures were placed in the fasciotomy. A Hassan trocar was placed into the abdomen. The camera was placed, the abdomen was insufflated, and the patient was rotated. An 8 mm robotic trocar was placed at the left mid clavicular line approximately 7 cm below the costal margin. A second 8 mm trocar was placed just right to the right midclavicular line, approximately the same distance. A 5 mm trocar was placed laterally at the anterior axillary line. Gallbladder was then lifted up over the dome of the liver. The patient was positioned. The robot was docked. Carefully the cystic duct was dissected out. A small probable lymphatic branch was also evaluated. There was an artery which appeared posterior  which was partially blocked by the cystic duct which appeared to be entering the gallbladder, and the bottom of the gallbladder was visualized. I was confident in the position of the duct and this was clipped 3 times and ligated. The lymphatic/small anterior artery was then clipped and ligated. The cystic artery was then dissected out and clipped 3 times and ligated. The gallbladder was then taken off the gallbladder fossa and brought out with an Endo Catch bag through the umbilical port. The abdomen was then irrigated with copious amounts of normal saline. When satisfied with hemostasis with clips, all trocars were removed, abdomen desufflated, robot was undocked, and the supraumbilical fascia was closed using the previously placed stay sutures. All port sites were then closed with a 4-0 Monocryl deep dermal.  Telfa gauze and tegaderm was then placed over each wound. The patient was then awoken, extubated and brought to the postanesthesia care unit. There were no immediate complications. Needle, sponge, and instrument count was correct at the end of the procedure.   ____________________________ Si Raiderhristopher A. Henritta Mutz, MD cal:sb D: 03/18/2014 08:51:21 ET T: 03/18/2014 10:22:36 ET JOB#: [id number]  cc: Cristal Deerhristopher A. Jquan Egelston, MD, <Dictator> Jarvis NewcomerHRISTOPHER A Judeen Geralds MD ELECTRONICALLY SIGNED 03/22/2014 8:35

## 2016-01-13 ENCOUNTER — Observation Stay
Admission: EM | Admit: 2016-01-13 | Discharge: 2016-01-14 | Disposition: A | Discharge status: Home / Self Care | Payer: BC Managed Care – PPO | Attending: Internal Medicine | Admitting: Internal Medicine

## 2016-01-13 ENCOUNTER — Encounter: Payer: Self-pay | Admitting: Emergency Medicine

## 2016-01-13 ENCOUNTER — Emergency Department: Payer: BC Managed Care – PPO

## 2016-01-13 DIAGNOSIS — R531 Weakness: Secondary | ICD-10-CM | POA: Diagnosis not present

## 2016-01-13 DIAGNOSIS — R5383 Other fatigue: Secondary | ICD-10-CM | POA: Diagnosis not present

## 2016-01-13 DIAGNOSIS — Z79899 Other long term (current) drug therapy: Secondary | ICD-10-CM | POA: Diagnosis not present

## 2016-01-13 DIAGNOSIS — Z9049 Acquired absence of other specified parts of digestive tract: Secondary | ICD-10-CM | POA: Diagnosis not present

## 2016-01-13 DIAGNOSIS — G4089 Other seizures: Secondary | ICD-10-CM | POA: Diagnosis not present

## 2016-01-13 DIAGNOSIS — F419 Anxiety disorder, unspecified: Secondary | ICD-10-CM | POA: Insufficient documentation

## 2016-01-13 DIAGNOSIS — R51 Headache: Secondary | ICD-10-CM | POA: Insufficient documentation

## 2016-01-13 DIAGNOSIS — R079 Chest pain, unspecified: Secondary | ICD-10-CM | POA: Insufficient documentation

## 2016-01-13 DIAGNOSIS — J329 Chronic sinusitis, unspecified: Secondary | ICD-10-CM | POA: Insufficient documentation

## 2016-01-13 DIAGNOSIS — R05 Cough: Secondary | ICD-10-CM | POA: Diagnosis not present

## 2016-01-13 DIAGNOSIS — J849 Interstitial pulmonary disease, unspecified: Secondary | ICD-10-CM | POA: Diagnosis not present

## 2016-01-13 DIAGNOSIS — R918 Other nonspecific abnormal finding of lung field: Secondary | ICD-10-CM | POA: Insufficient documentation

## 2016-01-13 DIAGNOSIS — M791 Myalgia, unspecified site: Secondary | ICD-10-CM | POA: Diagnosis present

## 2016-01-13 DIAGNOSIS — J029 Acute pharyngitis, unspecified: Secondary | ICD-10-CM | POA: Diagnosis not present

## 2016-01-13 DIAGNOSIS — I10 Essential (primary) hypertension: Secondary | ICD-10-CM | POA: Diagnosis not present

## 2016-01-13 DIAGNOSIS — R0989 Other specified symptoms and signs involving the circulatory and respiratory systems: Secondary | ICD-10-CM | POA: Insufficient documentation

## 2016-01-13 DIAGNOSIS — R4182 Altered mental status, unspecified: Secondary | ICD-10-CM | POA: Diagnosis present

## 2016-01-13 DIAGNOSIS — Z90710 Acquired absence of both cervix and uterus: Secondary | ICD-10-CM | POA: Insufficient documentation

## 2016-01-13 DIAGNOSIS — R509 Fever, unspecified: Secondary | ICD-10-CM | POA: Insufficient documentation

## 2016-01-13 DIAGNOSIS — R5381 Other malaise: Secondary | ICD-10-CM | POA: Diagnosis not present

## 2016-01-13 HISTORY — DX: Unspecified convulsions: R56.9

## 2016-01-13 HISTORY — DX: Essential (primary) hypertension: I10

## 2016-01-13 HISTORY — DX: Anxiety disorder, unspecified: F41.9

## 2016-01-13 LAB — CBC
HCT: 39.3 % (ref 35.0–47.0)
Hemoglobin: 13.5 g/dL (ref 12.0–16.0)
MCH: 30.5 pg (ref 26.0–34.0)
MCHC: 34.4 g/dL (ref 32.0–36.0)
MCV: 88.9 fL (ref 80.0–100.0)
Platelets: 146 K/uL — ABNORMAL LOW (ref 150–440)
RBC: 4.42 MIL/uL (ref 3.80–5.20)
RDW: 13.5 % (ref 11.5–14.5)
WBC: 5.6 K/uL (ref 3.6–11.0)

## 2016-01-13 LAB — RAPID INFLUENZA A&B ANTIGENS
Influenza A (ARMC): NOT DETECTED
Influenza B (ARMC): NOT DETECTED

## 2016-01-13 LAB — URINALYSIS COMPLETE WITH MICROSCOPIC (ARMC ONLY)
Bilirubin Urine: NEGATIVE
Glucose, UA: NEGATIVE mg/dL
Leukocytes, UA: NEGATIVE
Nitrite: NEGATIVE
PH: 6 (ref 5.0–8.0)
PROTEIN: NEGATIVE mg/dL
SPECIFIC GRAVITY, URINE: 1.012 (ref 1.005–1.030)

## 2016-01-13 LAB — COMPREHENSIVE METABOLIC PANEL
ALBUMIN: 3.8 g/dL (ref 3.5–5.0)
ALK PHOS: 100 U/L (ref 38–126)
ALT: 55 U/L — AB (ref 14–54)
ANION GAP: 8 (ref 5–15)
AST: 56 U/L — ABNORMAL HIGH (ref 15–41)
BUN: 9 mg/dL (ref 6–20)
CALCIUM: 8.2 mg/dL — AB (ref 8.9–10.3)
CO2: 23 mmol/L (ref 22–32)
Chloride: 108 mmol/L (ref 101–111)
Creatinine, Ser: 0.53 mg/dL (ref 0.44–1.00)
GFR calc Af Amer: >60 mL/min (ref >60)
GFR calc non Af Amer: >60 mL/min (ref >60)
GLUCOSE: 102 mg/dL — AB (ref 65–99)
Potassium: 3.5 mmol/L (ref 3.5–5.1)
SODIUM: 139 mmol/L (ref 135–145)
Total Bilirubin: 0.9 mg/dL (ref 0.3–1.2)
Total Protein: 7.2 g/dL (ref 6.5–8.1)

## 2016-01-13 MED ORDER — ONDANSETRON HCL 4 MG PO TABS: 4.0000 mg | ORAL_TABLET | Freq: Four times a day (QID) | ORAL | Status: DC | PRN

## 2016-01-13 MED ORDER — SODIUM CHLORIDE 0.9 % IV SOLN
INTRAVENOUS | Status: DC
Start: 2016-01-13 — End: 2016-01-14
  Administered 2016-01-13: 17:00:00 via INTRAVENOUS

## 2016-01-13 MED ORDER — ACETAMINOPHEN 325 MG PO TABS
650.0000 mg | ORAL_TABLET | Freq: Four times a day (QID) | ORAL | Status: DC | PRN
Start: 2016-01-13 — End: 2016-01-14
  Administered 2016-01-14: 650 mg via ORAL
  Filled 2016-01-13: qty 2

## 2016-01-13 MED ORDER — ONDANSETRON HCL 4 MG/2ML IJ SOLN: 4.0000 mg | Freq: Four times a day (QID) | INTRAMUSCULAR | Status: DC | PRN

## 2016-01-13 MED ORDER — ENOXAPARIN SODIUM 40 MG/0.4ML ~~LOC~~ SOLN
40.0000 mg | SUBCUTANEOUS | Status: DC
  Administered 2016-01-13: 40 mg via SUBCUTANEOUS
  Filled 2016-01-13: qty 0.4

## 2016-01-13 MED ORDER — ACETAMINOPHEN 325 MG PO TABS
650.0000 mg | ORAL_TABLET | Freq: Once | ORAL | Status: AC
  Administered 2016-01-13: 650 mg via ORAL
  Filled 2016-01-13: qty 2

## 2016-01-13 MED ORDER — LISINOPRIL 10 MG PO TABS
10.0000 mg | ORAL_TABLET | Freq: Every day | ORAL | Status: DC
  Administered 2016-01-13 – 2016-01-14 (×2): 10 mg via ORAL
  Filled 2016-01-13 (×2): qty 1

## 2016-01-13 MED ORDER — ADULT MULTIVITAMIN W/MINERALS CH
1.0000 | ORAL_TABLET | Freq: Every day | ORAL | Status: DC
  Administered 2016-01-14: 1 via ORAL
  Filled 2016-01-13: qty 1

## 2016-01-13 MED ORDER — ACETAMINOPHEN 650 MG RE SUPP: 650.0000 mg | Freq: Four times a day (QID) | RECTAL | Status: DC | PRN

## 2016-01-13 MED ORDER — SODIUM CHLORIDE 0.9 % IV BOLUS (SEPSIS)
1000.0000 mL | Freq: Once | INTRAVENOUS | Status: AC
  Administered 2016-01-13: 1000 mL via INTRAVENOUS

## 2016-01-13 MED ORDER — CLONAZEPAM 0.5 MG PO TABS
0.5000 mg | ORAL_TABLET | Freq: Every evening | ORAL | Status: DC
Start: 2016-01-13 — End: 2016-01-14
  Administered 2016-01-13: 0.5 mg via ORAL
  Filled 2016-01-13: qty 1

## 2016-01-13 MED ORDER — VITAMIN C 500 MG PO TABS
500.0000 mg | ORAL_TABLET | Freq: Every day | ORAL | Status: DC
  Administered 2016-01-13 – 2016-01-14 (×2): 500 mg via ORAL
  Filled 2016-01-13 (×2): qty 1

## 2016-01-13 NOTE — ED Provider Notes (Signed)
[facility] Emergency Department Provider Note  ____________________________________________  Time seen: 10:45 AM  I have reviewed the triage vital signs and the nursing notes.   HISTORY  Chief Complaint Fatigue and Headache      HPI Dana [name] is a 55 y.o. female resents from [facility] urgent care via EMS for generalized fatigue headache cough fever generalized myalgia 3 days. Per EMS patient became unresponsive to verbal stimuli while at the urgent care facility. Patient states that she is so weak "I cannot even walk".    Past Medical History  Diagnosis Date  . Seizures (HCC)   . Hypertension     Patient Active Problem List   Diagnosis Date Noted  . HYPERTENSION, BENIGN 12/29/2010  . FATIGUE / MALAISE 12/29/2010  . CHEST PAIN UNSPECIFIED 12/09/2010    Past Surgical History  Procedure Laterality Date  . Abdominal hysterectomy    . Cholecystectomy      Current Outpatient Rx  Name  Route  Sig  Dispense  Refill  . clonazePAM (KLONOPIN) 0.5 MG tablet   Oral   Take 1 tablet by mouth every evening.         Marland Kitchen lisinopril (PRINIVIL,ZESTRIL) 10 MG tablet   Oral   Take 10 mg by mouth daily.         . Ascorbic Acid (VITAMIN C PO)   Oral   Take by mouth.             Allergies Review of patient's allergies indicates no known allergies.  No family history on file.  Social History Social History  Substance Use Topics  . Smoking status: Never Smoker   . Smokeless tobacco: None     Comment: tobacco use- no   . Alcohol Use: Yes     Comment: occas    Review of Systems  Constitutional: Positive for fever. Eyes: Negative for visual changes. ENT: Negative for sore throat. Cardiovascular: Negative for chest pain. Respiratory: Negative for shortness of breath. Gastrointestinal: Negative for abdominal pain, vomiting and diarrhea. Genitourinary: Negative for dysuria. Musculoskeletal: Negative for back pain. Positive for  generalized myalgia Skin: Negative for rash. Neurological: Positive for generalized weakness and fatigue   10-point ROS otherwise negative.  ____________________________________________   PHYSICAL EXAM:  VITAL SIGNS: ED Triage Vitals  Enc Vitals Group     BP 01/13/16 1032 164/89 mmHg     Pulse Rate 01/13/16 1032 96     Resp --      Temp 01/13/16 1032 98.8 F (37.1 C)     Temp Source 01/13/16 1032 Oral     SpO2 01/13/16 1032 98 %     Weight 01/13/16 1032 170 lb (77.111 kg)     Height 01/13/16 1032  (1.651 m)     Head Cir --      Peak Flow --      Pain Score --      Pain Loc --      Pain Edu? --      Excl. in GC? --     Constitutional: Alert and oriented. Lethargic Eyes: Conjunctivae are normal. PERRL. Normal extraocular movements. ENT   Head: Normocephalic and atraumatic.   Nose: No congestion/rhinnorhea.   Mouth/Throat: Mucous membranes are moist.   Neck: No stridor. Hematological/Lymphatic/Immunilogical: No cervical lymphadenopathy. Cardiovascular: Normal rate, regular rhythm. Normal and symmetric distal pulses are present in all extremities. No murmurs, rubs, or gallops. Respiratory: Normal respiratory effort without tachypnea nor retractions. Breath sounds are clear and equal  bilaterally. No wheezes/rales/rhonchi. Gastrointestinal: Soft and nontender. No distention. There is no CVA tenderness. Genitourinary: deferred Musculoskeletal: Nontender with normal range of motion in all extremities. No joint effusions.  No lower extremity tenderness nor edema. Neurologic:  Normal speech and language. No gross focal neurologic deficits are appreciated. Speech is normal.  Skin:  Skin is warm, dry and intact. No rash noted. Psychiatric: Mood and affect are normal. Speech and behavior are normal. Patient exhibits appropriate insight and judgment.  ____________________________________________    LABS (pertinent positives/negatives)  Labs Reviewed  CBC -  Abnormal; Notable for the following:    Platelets 146 (*)    All other components within normal limits  COMPREHENSIVE METABOLIC PANEL - Abnormal; Notable for the following:    Glucose, Bld 102 (*)    Calcium 8.2 (*)    AST 56 (*)    ALT 55 (*)    All other components within normal limits  URINALYSIS COMPLETEWITH MICROSCOPIC ([facility] ONLY) - Abnormal; Notable for the following:    Color, Urine YELLOW (*)    APPearance HAZY (*)    Ketones, ur TRACE (*)    Hgb urine dipstick 1+ (*)    Bacteria, UA RARE (*)    Squamous Epithelial / LPF 6-30 (*)    All other components within normal limits  RAPID INFLUENZA A&B ANTIGENS ([facility] ONLY)       RADIOLOGY  CT Head Wo Contrast (Final result) Result time: 01/13/16 13:52:08   Final result by Rad Results In Interface (01/13/16 13:52:08)   Narrative:   CLINICAL DATA: Headache. Malaise. Altered mental status. History of seizures.  EXAM: CT HEAD WITHOUT CONTRAST  TECHNIQUE: Contiguous axial images were obtained from the base of the skull through the vertex without intravenous contrast.  COMPARISON: 10/16/2012.  FINDINGS: The skin technique is sub optimal. Grossly normal appearing cerebral hemispheres and posterior fossa structures. Normal size and position of the ventricles. No intracranial hemorrhage, mass lesion or CT evidence of acute infarction. Small amount of sphenoid, bilateral ethmoid and bilateral maxillary sinus mucosal thickening.  IMPRESSION: 1. No acute abnormality. 2. Mild chronic sinusitis.   Electronically Signed By: Beckie Salts M.D. On: 01/13/2016 13:52          DG Chest 2 View (Final result) Result time: 01/13/16 12:46:52   Final result by Rad Results In Interface (01/13/16 12:46:52)   Narrative:   CLINICAL DATA: Cough and fever.  EXAM: CHEST 2 VIEW  COMPARISON: 10/02/2012.  FINDINGS: Stable enlarged cardiac silhouette and prominence of the pulmonary vasculature and interstitial  markings. No airspace consolidation. Unremarkable bones.  IMPRESSION: 1. No acute abnormality. 2. Stable cardiomegaly, mild pulmonary vascular congestion and mild chronic interstitial lung disease.   Electronically Signed By: Beckie Salts M.D. On: 01/13/2016 12:46       INITIAL IMPRESSION / ASSESSMENT AND PLAN / ED COURSE  Pertinent labs & imaging results that were available during my care of the patient were reviewed by me and considered in my medical decision making (see chart for details).  Patient with generalize malaise, symptoms consistent with possible viral etiology. No clear etiology for the patient's episode of unresponsiveness noted emergency Department. Patient discussed with Dr. Argentina Ponder hospitals on call for admission for further evaluation and management  ____________________________________________   FINAL CLINICAL IMPRESSION(S) / ED DIAGNOSES  Final diagnoses:  Altered mental status, unspecified altered mental status type      Darci Current, MD 01/13/16 1517

## 2016-01-13 NOTE — ED Notes (Signed)
Assisted pt to toilet to void 

## 2016-01-13 NOTE — ED Notes (Signed)
Pt. returned from XR. 

## 2016-01-13 NOTE — ED Notes (Signed)
Patient transported to CT 

## 2016-01-13 NOTE — ED Notes (Signed)
Patient transported to X-ray 

## 2016-01-13 NOTE — H&P (Signed)
[facility] - [place] at [facility]   PATIENT NAME: Dana [name]    MR#:  [id number]  DATE OF BIRTH:  1961/07/20  DATE OF ADMISSION:  01/13/2016  PRIMARY CARE PHYSICIAN: [facility]   REQUESTING/REFERRING PHYSICIAN: Dr. Bayard Males  CHIEF COMPLAINT:   Chief Complaint  Patient presents with  . Fatigue  . Headache    HISTORY OF PRESENT ILLNESS:  Dana [name]  is a 55 y.o. female with a known history of Hypertension, pseudo seizures, anxiety sent in from PCP office for extreme weakness. Symptoms started about 5 days ago with rhinorrhea and sore throat, but since lats night, patient has been having fevers as high as 103 F, difficulty breathing and extreme weakness to the point that she cannot even get up from bed. Also has been having dry heaves, poor appetite. No diarrhea or abdominal pain. No sick contacts, no recent travel or tick bites. Flu test here is negative. Being admitted for weakness, possible viral syndrome.  PAST MEDICAL HISTORY:   Past Medical History  Diagnosis Date  . Seizures (HCC)     pseudo seizure history  . Hypertension   . Anxiety     PAST SURGICAL HISTORY:   Past Surgical History  Procedure Laterality Date  . Abdominal hysterectomy    . Cholecystectomy    . Appendectomy    . Broken arm      SOCIAL HISTORY:   Social History  Substance Use Topics  . Smoking status: Never Smoker   . Smokeless tobacco: Not on file     Comment: tobacco use- no   . Alcohol Use: 0.0 oz/week    0 Standard drinks or equivalent per week     Comment: occasional    FAMILY HISTORY:  No family history on file.  DRUG ALLERGIES:  No Known Allergies  REVIEW OF SYSTEMS:   Review of Systems  Constitutional: Positive for fever and malaise/fatigue. Negative for chills and weight loss.  HENT: Negative for ear discharge, ear pain, hearing loss, nosebleeds and tinnitus.   Eyes: Negative for blurred vision, double vision and  photophobia.  Respiratory: Positive for shortness of breath. Negative for cough, hemoptysis and wheezing.   Cardiovascular: Negative for chest pain, palpitations, orthopnea and leg swelling.  Gastrointestinal: Positive for nausea. Negative for heartburn, vomiting, abdominal pain, diarrhea, constipation and melena.  Genitourinary: Negative for dysuria, urgency and frequency.  Musculoskeletal: Positive for myalgias. Negative for back pain and neck pain.  Skin: Negative for rash.  Neurological: Positive for dizziness and headaches. Negative for tingling, sensory change, speech change and focal weakness.  Endo/Heme/Allergies: Does not bruise/bleed easily.  Psychiatric/Behavioral: Negative for depression.    MEDICATIONS AT HOME:   Prior to Admission medications   Medication Sig Start Date End Date Taking? Authorizing Provider  clonazePAM (KLONOPIN) 0.5 MG tablet Take 1 tablet by mouth every evening. 11/27/15  Yes Historical Provider, MD  lisinopril (PRINIVIL,ZESTRIL) 10 MG tablet Take 10 mg by mouth daily. 04/03/15  Yes Historical Provider, MD  Multiple Vitamins-Minerals (CENTRUM PO) Take 1 tablet by mouth daily.   Yes Historical Provider, MD  Ascorbic Acid (VITAMIN C PO) Take by mouth.      Historical Provider, MD      VITAL SIGNS:  Blood pressure 102/64, pulse 100, temperature 98.8 F (37.1 C), temperature source Oral, height 5\' 5"  (1.651 m), weight 77.111 kg (170 lb), SpO2 96 %.  PHYSICAL EXAMINATION:   Physical Exam  GENERAL:  55 y.o.-year-old patient lying in  the bed with no acute distress.  EYES: Pupils equal, round, reactive to light and accommodation. No scleral icterus. Extraocular muscles intact.  HEENT: Head atraumatic, normocephalic. Oropharynx and nasopharynx clear.  NECK:  Supple, no jugular venous distention. No thyroid enlargement, no tenderness.  LUNGS: Normal breath sounds bilaterally, no wheezing, rales,rhonchi or crepitation. No use of accessory muscles of respiration.  Decreased bibasilar breath sounds. CARDIOVASCULAR: S1, S2 normal. No murmurs, rubs, or gallops.  ABDOMEN: Soft, nontender, nondistended. Bowel sounds present. No organomegaly or mass.  EXTREMITIES: No pedal edema, cyanosis, or clubbing.  NEUROLOGIC: Cranial nerves II through XII are intact. Muscle strength 5/5 in all extremities. Sensation intact. Gait not checked. Generalized weakness noted. PSYCHIATRIC: The patient is alert and oriented x 3.  SKIN: No obvious rash, lesion, or ulcer.   LABORATORY PANEL:   CBC  Recent Labs Lab 01/13/16 1129  WBC 5.6  HGB 13.5  HCT 39.3  PLT 146*   ------------------------------------------------------------------------------------------------------------------  Chemistries   Recent Labs Lab 01/13/16 1129  NA 139  K 3.5  CL 108  CO2 23  GLUCOSE 102*  BUN 9  CREATININE 0.53  CALCIUM 8.2*  AST 56*  ALT 55*  ALKPHOS 100  BILITOT 0.9   ------------------------------------------------------------------------------------------------------------------  Cardiac Enzymes No results for input(s): TROPONINI in the last 168 hours. ------------------------------------------------------------------------------------------------------------------  RADIOLOGY:  Dg Chest 2 View  01/13/2016  CLINICAL DATA:  Cough and fever. EXAM: CHEST  2 VIEW COMPARISON:  10/02/2012. FINDINGS: Stable enlarged cardiac silhouette and prominence of the pulmonary vasculature and interstitial markings. No airspace consolidation. Unremarkable bones. IMPRESSION: 1. No acute abnormality. 2. Stable cardiomegaly, mild pulmonary vascular congestion and mild chronic interstitial lung disease. Electronically Signed   By: Beckie Salts M.D.   On: 01/13/2016 12:46   Ct Head Wo Contrast  01/13/2016  CLINICAL DATA:  Headache. Malaise. Altered mental status. History of seizures. EXAM: CT HEAD WITHOUT CONTRAST TECHNIQUE: Contiguous axial images were obtained from the base of the skull  through the vertex without intravenous contrast. COMPARISON:  10/16/2012. FINDINGS: The skin technique is sub optimal. Grossly normal appearing cerebral hemispheres and posterior fossa structures. Normal size and position of the ventricles. No intracranial hemorrhage, mass lesion or CT evidence of acute infarction. Small amount of sphenoid, bilateral ethmoid and bilateral maxillary sinus mucosal thickening. IMPRESSION: 1. No acute abnormality. 2. Mild chronic sinusitis. Electronically Signed   By: Beckie Salts M.D.   On: 01/13/2016 13:52    EKG:   Orders placed or performed in visit on 02/28/14  . EKG 12-Lead    IMPRESSION AND PLAN:   Dana [name]  is a 55 y.o. female with a known history of Hypertension, pseudo seizures, anxiety sent in from PCP office for extreme weakness.  #1 Weakness- acute viral syndrome likely If further fevers- consider LP t r/o meningitis- no typical neck stiffness or photophobia, but complains of headache and an ? Unresponsive episode. -Supportive treatment with IV fluids. -No other source of infection identified. --Worked with physical therapy in a.m.  #2 hypertension-continue lisinopril  #3 anxiety-continue Klonopin  #4 DVT prophylaxis-on Lovenox   Physical therapy consulted.   All the records are reviewed and case discussed with ED provider. Management plans discussed with the patient, family and they are in agreement.  CODE STATUS: Full Code  TOTAL TIME TAKING CARE OF THIS PATIENT: 50 minutes.    Enid Baas M.D on 01/13/2016 at 3:04 PM  Between 7am to 6pm - Pager - [phone]  After 6pm go to [email or web address] - password  [facility]  [place]  [facility]  Office  [phone]  CC: Primary care physician; [facility]

## 2016-01-13 NOTE — ED Notes (Signed)
Pt arrived via EMS from [facility] for complaints of fatigue, headache, and "not feeling well". EMS reports 150/98, 100 HR, T 99.2 oral, 94% RA, CBG 132.

## 2016-01-13 NOTE — ED Notes (Signed)
Pt returned from CT °

## 2016-01-14 LAB — BASIC METABOLIC PANEL
Anion gap: 6 (ref 5–15)
BUN: 9 mg/dL (ref 6–20)
CALCIUM: 8.3 mg/dL — AB (ref 8.9–10.3)
CO2: 24 mmol/L (ref 22–32)
CREATININE: 0.55 mg/dL (ref 0.44–1.00)
Chloride: 109 mmol/L (ref 101–111)
GFR calc Af Amer: >60 mL/min (ref >60)
Glucose, Bld: 99 mg/dL (ref 65–99)
Potassium: 3.9 mmol/L (ref 3.5–5.1)
SODIUM: 139 mmol/L (ref 135–145)

## 2016-01-14 LAB — CBC
HCT: 36.5 % (ref 35.0–47.0)
Hemoglobin: 12.6 g/dL (ref 12.0–16.0)
MCH: 30.5 pg (ref 26.0–34.0)
MCHC: 34.5 g/dL (ref 32.0–36.0)
MCV: 88.4 fL (ref 80.0–100.0)
PLATELETS: 144 10*3/uL — AB (ref 150–440)
RBC: 4.13 MIL/uL (ref 3.80–5.20)
RDW: 13.3 % (ref 11.5–14.5)
WBC: 3.1 10*3/uL — AB (ref 3.6–11.0)

## 2016-01-14 NOTE — Discharge Instructions (Signed)
Influenza, Adult Influenza (flu) is an infection in the mouth, nose, and throat (respiratory tract) caused by a virus. The flu can make you feel very ill. Influenza spreads easily from person to person (contagious).  HOME CARE   Only take medicines as told by your doctor.  Use a cool mist humidifier to make breathing easier.  Get plenty of rest until your fever goes away. This usually takes 3 to 4 days.  Drink enough fluids to keep your pee (urine) clear or pale yellow.  Cover your mouth and nose when you cough or sneeze.  Wash your hands well to avoid spreading the flu.  Stay home from work or school until your fever has been gone for at least 1 full day.  Get a flu shot every year. GET HELP RIGHT AWAY IF:   You have trouble breathing or feel short of breath.  Your skin or nails turn blue.  You have severe neck pain or stiffness.  You have a severe headache, facial pain, or earache.  Your fever gets worse or keeps coming back.  You feel sick to your stomach (nauseous), throw up (vomit), or have watery poop (diarrhea).  You have chest pain.  You have a deep cough that gets worse, or you cough up more thick spit (mucus). MAKE SURE YOU:   Understand these instructions.  Will watch your condition.  Will get help right away if you are not doing well or get worse.   This information is not intended to replace advice given to you by your health care provider. Make sure you discuss any questions you have with your health care provider.   Document Released: 08/16/2008 Document Revised: 11/28/2014 Document Reviewed: 02/06/2012 Elsevier Interactive Patient Education 2016 [employer]  

## 2016-01-14 NOTE — Progress Notes (Signed)
Pt discharged to home. Advised to maintain precautions for flu at home with family, limit visitors to limit exposure. Pt husband transported to home.

## 2016-01-14 NOTE — Progress Notes (Signed)
[facility] Physicians - [place] at [facility]   PATIENT NAME: Dana [name]    MR#:  [id number]  DATE OF BIRTH:  1961/02/06  SUBJECTIVE:  CHIEF COMPLAINT:   Chief Complaint  Patient presents with  . Fatigue  . Headache   feeling very tired, and not feeling well  REVIEW OF SYSTEMS:  Review of Systems  Constitutional: Positive for fever and malaise/fatigue. Negative for weight loss and diaphoresis.  HENT: Negative for ear discharge, ear pain, hearing loss, nosebleeds, sore throat and tinnitus.   Eyes: Negative for blurred vision and pain.  Respiratory: Negative for cough, hemoptysis, shortness of breath and wheezing.   Cardiovascular: Negative for chest pain, palpitations, orthopnea and leg swelling.  Gastrointestinal: Negative for heartburn, nausea, vomiting, abdominal pain, diarrhea, constipation and blood in stool.  Genitourinary: Negative for dysuria, urgency and frequency.  Musculoskeletal: Negative for myalgias and back pain.  Skin: Negative for itching and rash.  Neurological: Positive for weakness. Negative for dizziness, tingling, tremors, focal weakness, seizures and headaches.  Psychiatric/Behavioral: Negative for depression. The patient is not nervous/anxious.    DRUG ALLERGIES:  No Known Allergies VITALS:  Blood pressure 138/90, pulse 98, temperature 98 F (36.7 C), temperature source Oral, resp. rate 17, height  (1.651 m), weight 77.111 kg (170 lb), SpO2 97 %. PHYSICAL EXAMINATION:  Physical Exam  Constitutional: She is oriented to person, place, and time and well-developed, well-nourished, and in no distress.  HENT:  Head: Normocephalic and atraumatic.  Eyes: Conjunctivae and EOM are normal. Pupils are equal, round, and reactive to light.  Neck: Normal range of motion. Neck supple. No tracheal deviation present. No thyromegaly present.  Cardiovascular: Normal rate, regular rhythm and normal heart sounds.   Pulmonary/Chest: Effort normal and  breath sounds normal. No respiratory distress. She has no wheezes. She exhibits no tenderness.  Abdominal: Soft. Bowel sounds are normal. She exhibits no distension. There is no tenderness.  Musculoskeletal: Normal range of motion.  Neurological: She is alert and oriented to person, place, and time. No cranial nerve deficit.  Skin: Skin is warm and dry. No rash noted.  Psychiatric: Mood and affect normal.   LABORATORY PANEL:   CBC  Recent Labs Lab 01/14/16 0607  WBC 3.1*  HGB 12.6  HCT 36.5  PLT 144*   ------------------------------------------------------------------------------------------------------------------ Chemistries   Recent Labs Lab 01/13/16 1129 01/14/16 0607  NA 139 139  K 3.5 3.9  CL 108 109  CO2 23 24  GLUCOSE 102* 99  BUN 9 9  CREATININE 0.53 0.55  CALCIUM 8.2* 8.3*  AST 56*  --   ALT 55*  --   ALKPHOS 100  --   BILITOT 0.9  --    RADIOLOGY:  No results found. ASSESSMENT AND PLAN:   Dana [name] is a 55 y.o. female with a known history of Hypertension, pseudo seizures, anxiety sent in from PCP office for extreme weakness.  #1 Weakness- acute viral syndrome likely If further fevers- consider LP t r/o meningitis- no typical neck stiffness or photophobia, but complains of headache and an ? Unresponsive episode. -Supportive treatment with IV fluids.  #2 hypertension-continue lisinopril  #3 anxiety-continue Klonopin  #4 DVT prophylaxis-on Lovenox     All the records are reviewed and case discussed with Care Management/Social Worker. Management plans discussed with the patient, family and they are in agreement.  CODE STATUS: FULL CODE  TOTAL TIME TAKING CARE OF THIS PATIENT: 25 minutes.   More than 50% of the time was spent  in counseling/coordination of care: YES  POSSIBLE D/C IN AM, DEPENDING ON CLINICAL CONDITION.   [facility], Abigayle Wilinski M.D on 01/14/2016 at 2:30 PM  Between 7am to 6pm - Pager - [phone]  After 6pm go to [email or web address]  - password [email or web address]  [place] [facility]  Office  [phone]  CC: Primary care physician; [facility] Acute C  Note: This dictation was prepared with Dragon dictation along with smaller phrase technology. Any transcriptional errors that result from this process are unintentional.

## 2016-01-14 NOTE — Evaluation (Signed)
Physical Therapy Evaluation Patient Details Name: Dana [name] MRN: [id number] DOB: [birth year] Today's Date: 01/14/2016   History of Present Illness  presented to ER secondary to increased fever, difficulty breathing and extreme weakness with near syncopal episode; admitted with acute viral syndrome (flu swab negative)  Clinical Impression  Upon evaluation, patient alert and oriented to all information; follows all commands and demonstrates good insight/safety awareness.  Bilat UE/LEs grossly symmetrical and WFL for basic transfers and mobility, but globally weak and deconditioned due to acute illness (3+ to 4-/5).  Able to complete bed mobility with indep; sit/stand, basic transfers and gait (40') without assist device, cga/close sup.  Generally guarded with limited overall activity tolerance, but no overt buckling or LOB. Limited functional reach with dynamic activities, intermittently reaching for IV pole/walls/furniture for external stabilization as needed. Would benefit from skilled PT to address above deficits and promote optimal return to PLOF during remaining hospitalization; anticipate no skilled PT needs upon discharge.    Follow Up Recommendations No PT follow up    Equipment Recommendations       Recommendations for Other Services       Precautions / Restrictions Precautions Precautions: Fall Restrictions Weight Bearing Restrictions: No      Mobility  Bed Mobility Overal bed mobility: Modified Independent                Transfers Overall transfer level: Needs assistance Equipment used: None Transfers: Sit to/from Stand Sit to Stand: Supervision;Min guard            Ambulation/Gait Ambulation/Gait assistance: Supervision;Min guard Ambulation Distance (Feet): 40 Feet Assistive device: None       General Gait Details: reciprocal stepping pattern; slow and effortful, but no buckling or LOB.  Generally weak and deconditioned, limited overall activity  tolerance.  Stairs            Wheelchair Mobility    Modified Rankin (Stroke Patients Only)       Balance Overall balance assessment: Needs assistance Sitting-balance support: No upper extremity supported;Feet supported Sitting balance-Leahy Scale: Good     Standing balance support: No upper extremity supported Standing balance-Leahy Scale: Fair Standing balance comment: tends to reach for IV pole, furniture/walls for external stabilization with any movement outside immediate BOS                             Pertinent Vitals/Pain Pain Assessment: No/denies pain    Home Living Family/patient expects to be discharged to:: Private residence Living Arrangements: Spouse/significant other Available Help at Discharge: Family Type of Home: House Home Access: Level entry     Home Layout: One level Home Equipment: None      Prior Function Level of Independence: Independent         Comments: Indep with all ADLs, household and community mobility; no fall history.  Works full-time as [employer] officer.     Hand Dominance        Extremity/Trunk Assessment   Upper Extremity Assessment: Generalized weakness (grossly 4-/5 throughout)           Lower Extremity Assessment: Generalized weakness (generally 3+ to 4-/5 throughout)         Communication   Communication: No difficulties  Cognition Arousal/Alertness: Awake/alert Behavior During Therapy: WFL for tasks assessed/performed Overall Cognitive Status: Within Functional Limits for tasks assessed  General Comments      Exercises Other Exercises Other Exercises: Toilet transfer, ambulatory without assist device, cga/close sup; sit/stand from standard toilet, close sup, with grab bar; standing balance for hand hygiene at sink, close sup (functional standing reach approx 5-6" prior to seeking external stabilization)      Assessment/Plan    PT Assessment  Patient needs continued PT services  PT Diagnosis Difficulty walking;Generalized weakness   PT Problem List Decreased strength;Decreased range of motion;Decreased activity tolerance;Decreased balance;Decreased mobility;Decreased knowledge of use of DME;Decreased safety awareness;Decreased knowledge of precautions;Cardiopulmonary status limiting activity  PT Treatment Interventions DME instruction;Gait training;Functional mobility training;Therapeutic activities;Therapeutic exercise;Balance training;Patient/family education   PT Goals (Current goals can be found in the Care Plan section) Acute Rehab PT Goals Patient Stated Goal: to return home PT Goal Formulation: With patient Time For Goal Achievement: 01/28/16 Potential to Achieve Goals: Good    Frequency Min 2X/week   Barriers to discharge        Co-evaluation               End of Session Equipment Utilized During Treatment: Gait belt Activity Tolerance: Patient tolerated treatment well Patient left: in chair;with call bell/phone within reach;with chair alarm set Nurse Communication: Mobility status    Functional Assessment Tool Used: clinical judgement Functional Limitation: Mobility: Walking and moving around Mobility: Walking and Moving Around Current Status [phone]): At least 1 percent but less than 20 percent impaired, limited or restricted Mobility: Walking and Moving Around Goal Status [phone]): 0 percent impaired, limited or restricted    Time: 0912-0929 PT Time Calculation (min) (ACUTE ONLY): 17 min   Charges:   PT Evaluation $PT Eval Low Complexity: 1 Procedure PT Treatments $Therapeutic Activity: 8-22 mins   PT G Codes:   PT G-Codes **NOT FOR INPATIENT CLASS** Functional Assessment Tool Used: clinical judgement Functional Limitation: Mobility: Walking and moving around Mobility: Walking and Moving Around Current Status ([id number]): At least 1 percent but less than 20 percent impaired, limited or  restricted Mobility: Walking and Moving Around Goal Status [phone]): 0 percent impaired, limited or restricted    Tanyah Debruyne H. Manson Passey, PT, DPT, NCS 01/14/2016, 9:58 AM [phone]

## 2016-01-14 NOTE — Progress Notes (Signed)
Patient resting between care. No complaints of N/V tolerated meal tray. Patient up to BR with one assist. Patient denies pain.

## 2016-01-15 NOTE — Progress Notes (Signed)
[facility] Physicians - [place] at [facility]        Dana [name] was admitted to the Hospital on 01/13/2016 and Discharged 01/14/2016 and should be excused from work  for 2 days starting 01/13/2016 , may return to work without any restrictions 01/15/2016.  Delfino Lovett M.D on 01/15/2016,at 12:13 PM  [facility] Physicians - [place] at [facility]  [phone]

## 2016-01-16 NOTE — Discharge Summary (Signed)
[facility] Physicians - [place] at [facility]   PATIENT NAME: Dana [name]    MR#:  [id number]  DATE OF BIRTH:  1961/07/09  DATE OF ADMISSION:  01/13/2016 ADMITTING PHYSICIAN: Enid Baas, MD  DATE OF DISCHARGE: 01/14/2016  5:22 PM  PRIMARY CARE PHYSICIAN: Sula Rumple, MD   ADMISSION DIAGNOSIS:  Altered mental status, unspecified altered mental status type [R41.82]  DISCHARGE DIAGNOSIS:  Active Problems:   Myalgia   SECONDARY DIAGNOSIS:   Past Medical History  Diagnosis Date  . Seizures (HCC)     pseudo seizure history  . Hypertension   . Anxiety     HOSPITAL COURSE:  55 y.o. female with a known history of Hypertension, pseudo seizures, anxiety sent in from PCP office for extreme weakness. Symptoms started about 5 days ago with rhinorrhea and sore throat, but since 1 night, patient has been having fevers as high as 103 F, difficulty breathing and extreme weakness to the point that she cannot even get up from bed. Also has been having dry heaves, poor appetite. No diarrhea or abdominal pain. No sick contacts, no recent travel or tick bites. Please see Dr Prudencio Pair dictated H & P for further details.  Her symptoms were consistent with Acute viral syndrome. She improved significantly with symptomatic mgmt in 24 hrs and was D/C home on 2/23 in stable condition. She was agreeable with D/C plans.  DISCHARGE CONDITIONS:   stable  CONSULTS OBTAINED:     DRUG ALLERGIES:  No Known Allergies  DISCHARGE MEDICATIONS:   Discharge Medication List as of 01/14/2016  4:27 PM    CONTINUE these medications which have NOT CHANGED   Details  clonazePAM (KLONOPIN) 0.5 MG tablet Take 0.5 mg by mouth at bedtime. , Until Discontinued, Historical Med    HYDROcodone-acetaminophen (NORCO/VICODIN) 5-325 MG tablet Take 1 tablet by mouth every 4 (four) hours as needed for moderate pain., Until Discontinued, Historical Med    lisinopril (PRINIVIL,ZESTRIL) 10 MG tablet  Take 10 mg by mouth at bedtime. , Until Discontinued, Historical Med    vitamin C (ASCORBIC ACID) 500 MG tablet Take 500 mg by mouth at bedtime., Until Discontinued, Historical Med         DISCHARGE INSTRUCTIONS:    DIET:  Cardiac diet  DISCHARGE CONDITION:  Good  ACTIVITY:  Activity as tolerated  OXYGEN:  Home Oxygen: No.   Oxygen Delivery: room air  DISCHARGE LOCATION:  home   If you experience worsening of your admission symptoms, develop shortness of breath, life threatening emergency, suicidal or homicidal thoughts you must seek medical attention immediately by calling 911 or calling your MD immediately  if symptoms less severe.  You Must read complete instructions/literature along with all the possible adverse reactions/side effects for all the Medicines you take and that have been prescribed to you. Take any new Medicines after you have completely understood and accpet all the possible adverse reactions/side effects.   Please note  You were cared for by a hospitalist during your hospital stay. If you have any questions about your discharge medications or the care you received while you were in the hospital after you are discharged, you can call the unit and asked to speak with the hospitalist on call if the hospitalist that took care of you is not available. Once you are discharged, your primary care physician will handle any further medical issues. Please note that NO REFILLS for any discharge medications will be authorized once you are discharged, as it is imperative  that you return to your primary care physician (or establish a relationship with a primary care physician if you do not have one) for your aftercare needs so that they can reassess your need for medications and monitor your lab values.    On the day of Discharge:  VITAL SIGNS:  Blood pressure 130/54, pulse 83, temperature 98.3 F (36.8 C), temperature source Oral, resp. rate 17, height  (1.651 m),  weight 77.111 kg (170 lb), SpO2 97 %.  PHYSICAL EXAMINATION:  GENERAL:  55 y.o.-year-old patient lying in the bed with no acute distress.  EYES: Pupils equal, round, reactive to light and accommodation. No scleral icterus. Extraocular muscles intact.  HEENT: Head atraumatic, normocephalic. Oropharynx and nasopharynx clear.  NECK:  Supple, no jugular venous distention. No thyroid enlargement, no tenderness.  LUNGS: Normal breath sounds bilaterally, no wheezing, rales,rhonchi or crepitation. No use of accessory muscles of respiration.  CARDIOVASCULAR: S1, S2 normal. No murmurs, rubs, or gallops.  ABDOMEN: Soft, non-tender, non-distended. Bowel sounds present. No organomegaly or mass.  EXTREMITIES: No pedal edema, cyanosis, or clubbing.  NEUROLOGIC: Cranial nerves II through XII are intact. Muscle strength 5/5 in all extremities. Sensation intact. Gait not checked.  PSYCHIATRIC: The patient is alert and oriented x 3.  SKIN: No obvious rash, lesion, or ulcer.  DATA REVIEW:   CBC  Recent Labs Lab 01/14/16 0607  WBC 3.1*  HGB 12.6  HCT 36.5  PLT 144*    Chemistries   Recent Labs Lab 01/13/16 1129 01/14/16 0607  NA 139 139  K 3.5 3.9  CL 108 109  CO2 23 24  GLUCOSE 102* 99  BUN 9 9  CREATININE 0.53 0.55  CALCIUM 8.2* 8.3*  AST 56*  --   ALT 55*  --   ALKPHOS 100  --   BILITOT 0.9  --     Cardiac Enzymes No results for input(s): TROPONINI in the last 168 hours.  Microbiology Results  Results for orders placed or performed during the hospital encounter of 01/13/16  Rapid Influenza A&B Antigens [facility] only)     Status: None   Collection Time: 01/13/16 11:29 AM  Result Value Ref Range Status   Influenza A [facility]) NOT DETECTED  Final   Influenza B [facility]) NOT DETECTED  Final    RADIOLOGY:  No results found.   Management plans discussed with the patient, family and they are in agreement.  CODE STATUS:  Code Status History    Date Active Date Inactive Code Status  Order ID Comments User Context   01/13/2016  4:08 PM 01/14/2016  8:30 PM Full Code [id number]  Enid Baas, MD Inpatient      TOTAL TIME TAKING CARE OF THIS PATIENT: 45 minutes.    [facility], Siobhan Zaro M.D on 01/16/2016 at 2:42 PM  Between 7am to 6pm - Pager - [phone]  After 6pm go to [email or web address] - password [email or web address]  [facility]  Office  [phone]  CC: Primary care physician; Sula Rumple, MD  Note: This dictation was prepared with Dragon dictation along with smaller phrase technology. Any transcriptional errors that result from this process are unintentional.

## 2016-05-25 ENCOUNTER — Other Ambulatory Visit
Admission: RE | Admit: 2016-05-25 | Discharge: 2016-05-25 | Disposition: A | Discharge status: Home / Self Care | Payer: BC Managed Care – PPO | Source: Ambulatory Visit | Attending: Gastroenterology | Admitting: Gastroenterology

## 2016-05-25 DIAGNOSIS — R197 Diarrhea, unspecified: Secondary | ICD-10-CM | POA: Diagnosis not present

## 2016-05-25 LAB — GASTROINTESTINAL PANEL BY PCR, STOOL (REPLACES STOOL CULTURE)
Adenovirus F40/41: NOT DETECTED
Astrovirus: NOT DETECTED
Campylobacter species: NOT DETECTED
Cryptosporidium: NOT DETECTED
Cyclospora cayetanensis: NOT DETECTED
E. COLI O157: NOT DETECTED
ENTAMOEBA HISTOLYTICA: NOT DETECTED
ENTEROAGGREGATIVE E COLI (EAEC): NOT DETECTED
ENTEROTOXIGENIC E COLI (ETEC): NOT DETECTED
Enteropathogenic E coli (EPEC): DETECTED — AB
Giardia lamblia: NOT DETECTED
NOROVIRUS GI/GII: NOT DETECTED
PLESIMONAS SHIGELLOIDES: NOT DETECTED
ROTAVIRUS A: NOT DETECTED
SALMONELLA SPECIES: NOT DETECTED
SAPOVIRUS (I, II, IV, AND V): NOT DETECTED
SHIGELLA/ENTEROINVASIVE E COLI (EIEC): NOT DETECTED
Shiga like toxin producing E coli (STEC): NOT DETECTED
VIBRIO CHOLERAE: NOT DETECTED
VIBRIO SPECIES: NOT DETECTED
Yersinia enterocolitica: NOT DETECTED

## 2016-05-25 LAB — C DIFFICILE QUICK SCREEN W PCR REFLEX
C DIFFICILE (CDIFF) INTERP: NEGATIVE
C Diff antigen: NEGATIVE
C Diff toxin: NEGATIVE

## 2016-07-04 ENCOUNTER — Other Ambulatory Visit
Admission: RE | Admit: 2016-07-04 | Discharge: 2016-07-04 | Disposition: A | Discharge status: Home / Self Care | Payer: BC Managed Care – PPO | Source: Ambulatory Visit | Attending: Gastroenterology | Admitting: Gastroenterology

## 2016-07-04 DIAGNOSIS — R197 Diarrhea, unspecified: Secondary | ICD-10-CM | POA: Insufficient documentation

## 2016-07-04 LAB — GASTROINTESTINAL PANEL BY PCR, STOOL (REPLACES STOOL CULTURE)
ADENOVIRUS F40/41: NOT DETECTED
Astrovirus: NOT DETECTED
CRYPTOSPORIDIUM: NOT DETECTED
CYCLOSPORA CAYETANENSIS: NOT DETECTED
Campylobacter species: NOT DETECTED
E. COLI O157: NOT DETECTED
ENTEROAGGREGATIVE E COLI (EAEC): NOT DETECTED
ENTEROPATHOGENIC E COLI (EPEC): NOT DETECTED
Entamoeba histolytica: NOT DETECTED
Enterotoxigenic E coli (ETEC): NOT DETECTED
Giardia lamblia: NOT DETECTED
Norovirus GI/GII: NOT DETECTED
Plesimonas shigelloides: NOT DETECTED
Rotavirus A: NOT DETECTED
SALMONELLA SPECIES: NOT DETECTED
SHIGA LIKE TOXIN PRODUCING E COLI (STEC): NOT DETECTED
Sapovirus (I, II, IV, and V): NOT DETECTED
Shigella/Enteroinvasive E coli (EIEC): NOT DETECTED
VIBRIO CHOLERAE: NOT DETECTED
Vibrio species: NOT DETECTED
Yersinia enterocolitica: NOT DETECTED

## 2016-07-04 LAB — C DIFFICILE QUICK SCREEN W PCR REFLEX
C DIFFICLE (CDIFF) ANTIGEN: NEGATIVE
C Diff interpretation: NOT DETECTED
C Diff toxin: NEGATIVE

## 2016-08-15 ENCOUNTER — Other Ambulatory Visit: Payer: Self-pay | Admitting: Gastroenterology

## 2016-08-15 ENCOUNTER — Ambulatory Visit
Admission: RE | Admit: 2016-08-15 | Discharge: 2016-08-15 | Disposition: A | Discharge status: Home / Self Care | Payer: BC Managed Care – PPO | Source: Ambulatory Visit | Attending: Gastroenterology | Admitting: Gastroenterology

## 2016-08-15 DIAGNOSIS — R1084 Generalized abdominal pain: Secondary | ICD-10-CM | POA: Insufficient documentation

## 2016-08-16 ENCOUNTER — Emergency Department
Admission: EM | Admit: 2016-08-16 | Discharge: 2016-08-16 | Disposition: A | Discharge status: Home / Self Care | Payer: BC Managed Care – PPO | Attending: Emergency Medicine | Admitting: Emergency Medicine

## 2016-08-16 ENCOUNTER — Emergency Department: Payer: BC Managed Care – PPO

## 2016-08-16 ENCOUNTER — Encounter: Payer: Self-pay | Admitting: Emergency Medicine

## 2016-08-16 DIAGNOSIS — R112 Nausea with vomiting, unspecified: Secondary | ICD-10-CM | POA: Diagnosis not present

## 2016-08-16 DIAGNOSIS — R1013 Epigastric pain: Secondary | ICD-10-CM | POA: Diagnosis present

## 2016-08-16 DIAGNOSIS — Z79899 Other long term (current) drug therapy: Secondary | ICD-10-CM | POA: Insufficient documentation

## 2016-08-16 DIAGNOSIS — I10 Essential (primary) hypertension: Secondary | ICD-10-CM | POA: Insufficient documentation

## 2016-08-16 LAB — URINALYSIS COMPLETE WITH MICROSCOPIC (ARMC ONLY)
BACTERIA UA: NONE SEEN
Bilirubin Urine: NEGATIVE
GLUCOSE, UA: NEGATIVE mg/dL
HGB URINE DIPSTICK: NEGATIVE
LEUKOCYTES UA: NEGATIVE
NITRITE: NEGATIVE
PH: 7 (ref 5.0–8.0)
Protein, ur: NEGATIVE mg/dL
SPECIFIC GRAVITY, URINE: 1.016 (ref 1.005–1.030)

## 2016-08-16 LAB — CBC WITH DIFFERENTIAL/PLATELET
BASOS PCT: 1 %
Basophils Absolute: 0.1 10*3/uL (ref 0–0.1)
EOS ABS: 0 10*3/uL (ref 0–0.7)
Eosinophils Relative: 1 %
HCT: 39.1 % (ref 35.0–47.0)
Hemoglobin: 13.9 g/dL (ref 12.0–16.0)
Lymphocytes Relative: 18 %
Lymphs Abs: 1.3 10*3/uL (ref 1.0–3.6)
MCH: 31.7 pg (ref 26.0–34.0)
MCHC: 35.6 g/dL (ref 32.0–36.0)
MCV: 89.2 fL (ref 80.0–100.0)
MONO ABS: 0.4 10*3/uL (ref 0.2–0.9)
MONOS PCT: 5 %
Neutro Abs: 5.8 10*3/uL (ref 1.4–6.5)
Neutrophils Relative %: 75 %
PLATELETS: 211 10*3/uL (ref 150–440)
RBC: 4.38 MIL/uL (ref 3.80–5.20)
RDW: 13.7 % (ref 11.5–14.5)
WBC: 7.6 10*3/uL (ref 3.6–11.0)

## 2016-08-16 LAB — COMPREHENSIVE METABOLIC PANEL
ALBUMIN: 3.9 g/dL (ref 3.5–5.0)
ALK PHOS: 91 U/L (ref 38–126)
ALT: 26 U/L (ref 14–54)
ANION GAP: 9 (ref 5–15)
AST: 22 U/L (ref 15–41)
BILIRUBIN TOTAL: 0.9 mg/dL (ref 0.3–1.2)
BUN: 12 mg/dL (ref 6–20)
CALCIUM: 9.1 mg/dL (ref 8.9–10.3)
CO2: 25 mmol/L (ref 22–32)
CREATININE: 0.51 mg/dL (ref 0.44–1.00)
Chloride: 105 mmol/L (ref 101–111)
GFR calc Af Amer: >60 mL/min (ref >60)
GFR calc non Af Amer: >60 mL/min (ref >60)
GLUCOSE: 108 mg/dL — AB (ref 65–99)
Potassium: 3.7 mmol/L (ref 3.5–5.1)
Sodium: 139 mmol/L (ref 135–145)
TOTAL PROTEIN: 7.1 g/dL (ref 6.5–8.1)

## 2016-08-16 LAB — LIPASE, BLOOD: Lipase: 27 U/L (ref 11–51)

## 2016-08-16 LAB — ETHANOL: Alcohol, Ethyl (B): <5 mg/dL (ref <5)

## 2016-08-16 MED ORDER — ONDANSETRON HCL 4 MG/2ML IJ SOLN
4.0000 mg | Freq: Once | INTRAMUSCULAR | Status: AC
  Administered 2016-08-16: 4 mg via INTRAVENOUS

## 2016-08-16 MED ORDER — ONDANSETRON 4 MG PO TBDP: 4.0000 mg | ORAL_TABLET | Freq: Three times a day (TID) | ORAL | 0 refills | Status: DC | PRN

## 2016-08-16 MED ORDER — SODIUM CHLORIDE 0.9 % IV BOLUS (SEPSIS)
1000.0000 mL | Freq: Once | INTRAVENOUS | Status: AC
  Administered 2016-08-16: 1000 mL via INTRAVENOUS

## 2016-08-16 MED ORDER — METOCLOPRAMIDE HCL 5 MG/ML IJ SOLN
10.0000 mg | Freq: Once | INTRAMUSCULAR | Status: AC
  Administered 2016-08-16: 10 mg via INTRAVENOUS
  Filled 2016-08-16: qty 2

## 2016-08-16 MED ORDER — IOPAMIDOL (ISOVUE-300) INJECTION 61%
30.0000 mL | Freq: Once | INTRAVENOUS | Status: AC
  Administered 2016-08-16: 30 mL via ORAL

## 2016-08-16 MED ORDER — PROMETHAZINE HCL 12.5 MG PO TABS: 12.5000 mg | ORAL_TABLET | Freq: Four times a day (QID) | ORAL | 0 refills | Status: DC | PRN

## 2016-08-16 MED ORDER — PROMETHAZINE HCL 25 MG RE SUPP: 25.0000 mg | Freq: Four times a day (QID) | RECTAL | 0 refills | Status: DC | PRN

## 2016-08-16 MED ORDER — ONDANSETRON HCL 4 MG/2ML IJ SOLN
INTRAMUSCULAR | Status: AC
  Filled 2016-08-16: qty 2

## 2016-08-16 MED ORDER — ONDANSETRON HCL 4 MG/2ML IJ SOLN
INTRAMUSCULAR | Status: AC
  Administered 2016-08-16: 4 mg via INTRAVENOUS
  Filled 2016-08-16: qty 2

## 2016-08-16 MED ORDER — IOPAMIDOL (ISOVUE-300) INJECTION 61%
100.0000 mL | Freq: Once | INTRAVENOUS | Status: AC | PRN
  Administered 2016-08-16: 100 mL via INTRAVENOUS

## 2016-08-16 MED ORDER — PROMETHAZINE HCL 25 MG/ML IJ SOLN
25.0000 mg | Freq: Once | INTRAMUSCULAR | Status: AC
  Administered 2016-08-16: 25 mg via INTRAVENOUS
  Filled 2016-08-16: qty 1

## 2016-08-16 MED ORDER — HYDROMORPHONE HCL 1 MG/ML IJ SOLN
1.0000 mg | Freq: Once | INTRAMUSCULAR | Status: AC
  Administered 2016-08-16: 1 mg via INTRAVENOUS
  Filled 2016-08-16: qty 1

## 2016-08-16 MED ORDER — ONDANSETRON HCL 4 MG/2ML IJ SOLN
4.0000 mg | Freq: Once | INTRAMUSCULAR | Status: AC
  Administered 2016-08-16: 4 mg via INTRAVENOUS
  Filled 2016-08-16: qty 2

## 2016-08-16 MED ORDER — GI COCKTAIL ~~LOC~~
30.0000 mL | ORAL | Status: AC
  Administered 2016-08-16: 30 mL via ORAL
  Filled 2016-08-16: qty 30

## 2016-08-16 NOTE — ED Notes (Signed)
Patient up to the bedside commode with assistance

## 2016-08-16 NOTE — Discharge Instructions (Signed)
Please take a clear liquid diet for the next 24-48 hours, then advance to a bland BRAT diet as tolerated.  Return to the emergency department if you develop severe pain, inability to keep down fluids, lightheadedness or fainting, fever, or any other symptoms concerning to you.

## 2016-08-16 NOTE — ED Notes (Signed)
Pt tolerating fluid well. 

## 2016-08-16 NOTE — ED Triage Notes (Signed)
Pt to ED from home via EMS c/o abd pain.  EMS states patient having GI problems since June, but worsening yesterday.  Reports patient had colonoscopy last week without complications.  EMS administered 4mg  zofran IV.

## 2016-08-16 NOTE — ED Provider Notes (Signed)
[facility] Emergency Department Provider Note  ____________________________________________  Time seen: Approximately 11:56 AM  I have reviewed the triage vital signs and the nursing notes.   HISTORY  Chief Complaint Abdominal Pain    HPI Dana [name] is a 55 y.o. female who complains of epigastric abdominal pain since last night. Nonradiating, severe, cramping and aching, no aggravating or alleviating factors. Not worsened with eating. He feels like appendicitis that she had before. Had a colonoscopy a week ago where they removed a polyp. No fevers or chills but she has vomited today. No diarrhea or bloody stool     Past Medical History:  Diagnosis Date  . Anxiety   . Hypertension   . Seizures (HCC)    pseudo seizure history     Patient Active Problem List   Diagnosis Date Noted  . Myalgia 01/13/2016  . HYPERTENSION, BENIGN 12/29/2010  . FATIGUE / MALAISE 12/29/2010  . CHEST PAIN UNSPECIFIED 12/09/2010     Past Surgical History:  Procedure Laterality Date  . ABDOMINAL HYSTERECTOMY    . APPENDECTOMY    . Broken arm    . CHOLECYSTECTOMY       Prior to Admission medications   Medication Sig Start Date End Date Taking? Authorizing Provider  clonazePAM (KLONOPIN) 0.5 MG tablet Take 0.5 mg by mouth at bedtime.     Historical Provider, MD  HYDROcodone-acetaminophen (NORCO/VICODIN) 5-325 MG tablet Take 1 tablet by mouth every 4 (four) hours as needed for moderate pain.    Historical Provider, MD  lisinopril (PRINIVIL,ZESTRIL) 10 MG tablet Take 10 mg by mouth at bedtime.     Historical Provider, MD  vitamin C (ASCORBIC ACID) 500 MG tablet Take 500 mg by mouth at bedtime.    Historical Provider, MD     Allergies Review of patient's allergies indicates no known allergies.   History reviewed. No pertinent family history.  Social History Social History  Substance Use Topics  . Smoking status: Never Smoker  . Smokeless tobacco: Never  Used     Comment: tobacco use- no   . Alcohol use No    Review of Systems  Constitutional:   No fever or chills.  ENT:   No sore throat. No rhinorrhea. Cardiovascular:   No chest pain. Respiratory:   No dyspnea or cough. Gastrointestinal:   Positive epigastric pain with vomiting.  Genitourinary:   Negative for dysuria or difficulty urinating. 10-point ROS otherwise negative.  ____________________________________________   PHYSICAL EXAM:  VITAL SIGNS: ED Triage Vitals  Enc Vitals Group     BP 08/16/16 1154 (!) 159/94     Pulse Rate 08/16/16 1154 74     Resp 08/16/16 1154 16     Temp 08/16/16 1154 97.6 F (36.4 C)     Temp src --      SpO2 08/16/16 1154 97 %     Weight 08/16/16 1155 170 lb (77.1 kg)     Height 08/16/16 1155 5\' 5"  (1.651 m)     Head Circumference --      Peak Flow --      Pain Score 08/16/16 1155 10     Pain Loc --      Pain Edu? --      Excl. in GC? --     Vital signs reviewed, nursing assessments reviewed.   Constitutional:   Alert and oriented. Ill-appearing Eyes:   No scleral icterus. No conjunctival pallor. PERRL. EOMI.  No nystagmus. ENT   Head:  Normocephalic and atraumatic.   Nose:   No congestion/rhinnorhea. No septal hematoma   Mouth/Throat:   MMM, no pharyngeal erythema. No peritonsillar mass.    Neck:   No stridor. No SubQ emphysema. No meningismus. Hematological/Lymphatic/Immunilogical:   No cervical lymphadenopathy. Cardiovascular:   RRR. Symmetric bilateral radial and DP pulses.  No murmurs.  Respiratory:   Normal respiratory effort without tachypnea nor retractions. Breath sounds are clear and equal bilaterally. No wheezes/rales/rhonchi. Gastrointestinal:   Soft with epigastric tenderness. Non distended. There is no CVA tenderness.  No rebound, rigidity, or guarding. Genitourinary:   deferred Musculoskeletal:   Nontender with normal range of motion in all extremities. No joint effusions.  No lower extremity tenderness.   No edema. Neurologic:   Normal speech and language.  CN 2-10 normal. Motor grossly intact. No gross focal neurologic deficits are appreciated.  Skin:    Skin is warm, dry and intact. No rash noted.  No petechiae, purpura, or bullae.  ____________________________________________    LABS (pertinent positives/negatives) (all labs ordered are listed, but only abnormal results are displayed) Labs Reviewed  COMPREHENSIVE METABOLIC PANEL - Abnormal; Notable for the following:       Result Value   Glucose, Bld 108 (*)    All other components within normal limits  URINALYSIS COMPLETEWITH MICROSCOPIC ([facility] ONLY) - Abnormal; Notable for the following:    Color, Urine YELLOW (*)    APPearance CLEAR (*)    Ketones, ur TRACE (*)    Squamous Epithelial / LPF 0-5 (*)    All other components within normal limits  LIPASE, BLOOD  CBC WITH DIFFERENTIAL/PLATELET  ETHANOL   ____________________________________________   EKG  Interpreted by me Sinus rhythm rate of 79, normal axis intervals QRS ST segments and T waves  ____________________________________________    RADIOLOGY  CT abdomen and pelvis pending  ____________________________________________   PROCEDURES Procedures  ____________________________________________   INITIAL IMPRESSION / ASSESSMENT AND PLAN / ED COURSE  Pertinent labs & imaging results that were available during my care of the patient were reviewed by me and considered in my medical decision making (see chart for details).  Patient is uncomfortable, but not in distress. We'll check labs and CT scan. IV fluids, Dilaudid, Zofran as needed.    ----------------------------------------- 3:08 PM on 08/16/2016 -----------------------------------------  Patient is continued vomiting and having difficulty tolerating oral contrast. This is refractory to IV Zofran. I asked that they proceed with CT despite only being able to drink about half a bottle of contrast  over a long period of time.  CT pending at this time. Case signed out to Dr. Sharma Covert with plan to follow up on CT, reassess, possibly requires hospitalization for intractable pain and vomiting.   Clinical Course   ____________________________________________   FINAL CLINICAL IMPRESSION(S) / ED DIAGNOSES  Final diagnoses:  Acute epigastric pain  Nausea and vomiting, vomiting of unspecified type       Portions of this note were generated with dragon dictation software. Dictation errors may occur despite best attempts at proofreading.    Sharman Cheek, MD 08/16/16 [phone]

## 2016-08-16 NOTE — ED Notes (Signed)
Pt discharged by Freddy JakschNellie, RN

## 2016-08-16 NOTE — ED Provider Notes (Signed)
This patient was signed out to me by Dr. Scotty CourtStafford. 55 year old female with diarrhea and abdominal pain since June, now with nausea and vomiting today. Overall, the patient is well-appearing and continues to have stable vital signs. The CT of her abdomen does not show any acute process. She states that her nausea has significantly improved but has not resolved. I will treat her with Phenergan and a by mouth challenge. If she is able to tolerate liquid. We will plan to discharge her home.   Rockne MenghiniAnne-Caroline Alveria Mcglaughlin, MD 08/16/16 1650

## 2016-08-16 NOTE — ED Notes (Signed)
Pt given water cup

## 2016-08-16 NOTE — ED Notes (Signed)
Patient transported to CT 

## 2016-08-18 ENCOUNTER — Ambulatory Visit: Payer: BC Managed Care – PPO

## 2018-03-01 ENCOUNTER — Ambulatory Visit
Admission: EM | Admit: 2018-03-01 | Discharge: 2018-03-01 | Disposition: A | Discharge status: Home / Self Care | Payer: BC Managed Care – PPO | Attending: Family Medicine | Admitting: Family Medicine

## 2018-03-01 ENCOUNTER — Encounter: Payer: Self-pay | Admitting: *Deleted

## 2018-03-01 DIAGNOSIS — F419 Anxiety disorder, unspecified: Secondary | ICD-10-CM | POA: Diagnosis not present

## 2018-03-01 DIAGNOSIS — R0789 Other chest pain: Secondary | ICD-10-CM | POA: Insufficient documentation

## 2018-03-01 DIAGNOSIS — Z90710 Acquired absence of both cervix and uterus: Secondary | ICD-10-CM | POA: Diagnosis not present

## 2018-03-01 DIAGNOSIS — L74513 Primary focal hyperhidrosis, soles: Secondary | ICD-10-CM

## 2018-03-01 DIAGNOSIS — I10 Essential (primary) hypertension: Secondary | ICD-10-CM | POA: Insufficient documentation

## 2018-03-01 DIAGNOSIS — Z9889 Other specified postprocedural states: Secondary | ICD-10-CM | POA: Diagnosis not present

## 2018-03-01 DIAGNOSIS — Z9049 Acquired absence of other specified parts of digestive tract: Secondary | ICD-10-CM | POA: Insufficient documentation

## 2018-03-01 DIAGNOSIS — R110 Nausea: Secondary | ICD-10-CM

## 2018-03-01 NOTE — ED Triage Notes (Signed)
Sudden onset left sided chest pain today approx 1630 while attending to her grandchild. Pain is pressure quality, 5/10, radiates to left arm. At onset pt also had diaphoresis, dyspnea, and weakness. Has had 1 similar episode of chest pain and was worked up by a cardiologist and dx with chest wall pain.

## 2018-03-01 NOTE — Discharge Instructions (Signed)
Rest.  Tylenol, Naproxen as needed.  If you worsen, please go to the hospital.  Take care  Dr. Adriana Simasook

## 2018-03-01 NOTE — ED Provider Notes (Signed)
[name]   CSN: [id number] Arrival date & time: 03/01/18  1803  History   Chief Complaint Chief Complaint  Patient presents with  . Chest Pain   HPI  57 year old female presents with left-sided chest pain.  Occurred at 430 this afternoon.  She states that prior to her chest pain she lifted up her granddaughter and put her in a car seat as well as took her out of the car seat.  Patient states that at 430 she had left-sided chest pain, 5/10 in severity.  Radiated to the left arm.  Patient states that she had diaphoresis.  No shortness of breath.  Some nausea.  Patient states that she has had prior bouts of chest pain which presented similarly.  She states that this 1 is a little more severe.  Patient states that she has a history of chest wall pain.  She states that she feels like this is from stress as she is under a great deal of stress at this time.  Patient states that she has been exhausted as she is been caring for her father.  She states that her chest pain is currently improved.  No other associated symptoms.  No other complaints or concerns at this time.  Past Medical History:  Diagnosis Date  . Anxiety   . Hypertension   . Seizures (HCC)    pseudo seizure history   Patient Active Problem List   Diagnosis Date Noted  . Myalgia 01/13/2016  . HYPERTENSION, BENIGN 12/29/2010  . FATIGUE / MALAISE 12/29/2010  . CHEST PAIN UNSPECIFIED 12/09/2010   Past Surgical History:  Procedure Laterality Date  . ABDOMINAL HYSTERECTOMY    . APPENDECTOMY    . Broken arm    . CHOLECYSTECTOMY     OB History   None    Home Medications    Prior to Admission medications   Not on File    Family History Family History  Problem Relation Age of Onset  . Other Mother   . Healthy Father     Social History Social History   Tobacco Use  . Smoking status: Never Smoker  . Smokeless tobacco: Never Used  . Tobacco comment: tobacco use- no   Substance Use Topics  .  Alcohol use: No    Alcohol/week: 0.0 oz  . Drug use: No     Allergies   Patient has no known allergies.   Review of Systems Review of Systems  Constitutional: Positive for diaphoresis.  Cardiovascular: Positive for chest pain.  Gastrointestinal: Positive for nausea.   Physical Exam Triage Vital Signs ED Triage Vitals [03/01/18 1812]  Enc Vitals Group     BP (!) 148/87     Pulse Rate (!) 102     Resp 16     Temp 98.7 F (37.1 C)     Temp Source Oral     SpO2 100 %     Weight 169 lb (76.7 kg)     Height 5\' 5"  (1.651 m)     Head Circumference      Peak Flow      Pain Score 5     Pain Loc      Pain Edu?      Excl. in GC?    Updated Vital Signs BP (!) 148/87 (BP Location: Left Arm)   Pulse (!) 102   Temp 98.7 F (37.1 C) (Oral)   Resp 16   Ht 5\' 5"  (1.651 m)   Wt 169 lb (76.7  kg)   SpO2 100%   BMI 28.12 kg/m   Physical Exam  Constitutional: She is oriented to person, place, and time. She appears well-developed. No distress.  HENT:  Head: Normocephalic and atraumatic.  Mouth/Throat: Oropharynx is clear and moist.  Eyes: Conjunctivae are normal.  Cardiovascular: Regular rhythm.  Tachycardia.  Pulmonary/Chest: Effort normal and breath sounds normal. She has no wheezes. She has no rales. She exhibits tenderness.  Abdominal: Soft. She exhibits no distension. There is no tenderness.  Neurological: She is alert and oriented to person, place, and time.  Psychiatric: Her behavior is normal.  Flat affect.  Nursing note and vitals reviewed.  UC Treatments / Results  Labs (all labs ordered are listed, but only abnormal results are displayed) Labs Reviewed - No data to display  ED ECG REPORT   Date: 03/01/2018  EKG Time: 7:17 PM  Rate: 102  Rhythm: Sinus tachycardia.  Axis: Normal.   Intervals:Normal  ST&T Change: None.  Narrative Interpretation: Sinus tachycardia at the rate of 102.  Normal intervals.  No ST or T wave changes.  Normal EKG.  Radiology No  results found.  Procedures Procedures (including critical care time)  Medications Ordered in UC Medications - No data to display   Initial Impression / Assessment and Plan / UC Course  I have reviewed the triage vital signs and the nursing notes.  Pertinent labs & imaging results that were available during my care of the patient were reviewed by me and considered in my medical decision making (see chart for details).    57 year old female presents with chest pain.  Appears to be musculoskeletal in origin.  We had a lengthy discussion about this and my recommendation to go to the ER for chest pain rule out.  Patient is confident that this is from stress and from chest wall pain.  She is going to go home and rest.  Supportive care.  Tylenol and naproxen as needed.  Final Clinical Impressions(s) / UC Diagnoses   Final diagnoses:  Chest wall pain    ED Discharge Orders    None     Controlled Substance Prescriptions [place] Controlled Substance Registry consulted? Not Applicable   Tommie SamsCook, Marisol Giambra G, DO 03/01/18 1919

## 2018-05-22 ENCOUNTER — Other Ambulatory Visit: Payer: Self-pay | Admitting: Nurse Practitioner

## 2018-05-22 DIAGNOSIS — Z1231 Encounter for screening mammogram for malignant neoplasm of breast: Secondary | ICD-10-CM

## 2018-06-05 ENCOUNTER — Inpatient Hospital Stay: Admission: RE | Admit: 2018-06-05 | Payer: BC Managed Care – PPO | Source: Ambulatory Visit

## 2018-06-11 ENCOUNTER — Ambulatory Visit
Admission: EM | Admit: 2018-06-11 | Discharge: 2018-06-11 | Disposition: A | Discharge status: Home / Self Care | Payer: BC Managed Care – PPO | Attending: Family Medicine | Admitting: Family Medicine

## 2018-06-11 ENCOUNTER — Other Ambulatory Visit: Payer: Self-pay

## 2018-06-11 ENCOUNTER — Encounter: Payer: Self-pay | Admitting: Gynecology

## 2018-06-11 DIAGNOSIS — I10 Essential (primary) hypertension: Secondary | ICD-10-CM | POA: Insufficient documentation

## 2018-06-11 DIAGNOSIS — R5383 Other fatigue: Secondary | ICD-10-CM | POA: Diagnosis not present

## 2018-06-11 DIAGNOSIS — R6883 Chills (without fever): Secondary | ICD-10-CM

## 2018-06-11 DIAGNOSIS — F419 Anxiety disorder, unspecified: Secondary | ICD-10-CM | POA: Diagnosis not present

## 2018-06-11 DIAGNOSIS — R569 Unspecified convulsions: Secondary | ICD-10-CM | POA: Insufficient documentation

## 2018-06-11 DIAGNOSIS — R531 Weakness: Secondary | ICD-10-CM | POA: Diagnosis present

## 2018-06-11 LAB — CBC WITH DIFFERENTIAL/PLATELET
BASOS ABS: 0.1 10*3/uL (ref 0–0.1)
BASOS PCT: 1 %
Eosinophils Absolute: 0.1 10*3/uL (ref 0–0.7)
Eosinophils Relative: 1 %
HEMATOCRIT: 40.2 % (ref 35.0–47.0)
Hemoglobin: 13.7 g/dL (ref 12.0–16.0)
LYMPHS PCT: 24 %
Lymphs Abs: 1.8 10*3/uL (ref 1.0–3.6)
MCH: 30.8 pg (ref 26.0–34.0)
MCHC: 34 g/dL (ref 32.0–36.0)
MCV: 90.6 fL (ref 80.0–100.0)
MONO ABS: 0.5 10*3/uL (ref 0.2–0.9)
MONOS PCT: 7 %
NEUTROS ABS: 5.1 10*3/uL (ref 1.4–6.5)
NEUTROS PCT: 67 %
Platelets: 190 10*3/uL (ref 150–440)
RBC: 4.44 MIL/uL (ref 3.80–5.20)
RDW: 13.3 % (ref 11.5–14.5)
WBC: 7.5 10*3/uL (ref 3.6–11.0)

## 2018-06-11 LAB — COMPREHENSIVE METABOLIC PANEL
ALBUMIN: 4 g/dL (ref 3.5–5.0)
ALT: 20 U/L (ref 0–44)
AST: 17 U/L (ref 15–41)
Alkaline Phosphatase: 73 U/L (ref 38–126)
Anion gap: 9 (ref 5–15)
BILIRUBIN TOTAL: 0.6 mg/dL (ref 0.3–1.2)
BUN: 15 mg/dL (ref 6–20)
CO2: 25 mmol/L (ref 22–32)
Calcium: 8.9 mg/dL (ref 8.9–10.3)
Chloride: 103 mmol/L (ref 98–111)
Creatinine, Ser: 0.58 mg/dL (ref 0.44–1.00)
GFR calc Af Amer: >60 mL/min (ref >60)
GFR calc non Af Amer: >60 mL/min (ref >60)
GLUCOSE: 106 mg/dL — AB (ref 70–99)
Potassium: 3.8 mmol/L (ref 3.5–5.1)
Sodium: 137 mmol/L (ref 135–145)
TOTAL PROTEIN: 7.3 g/dL (ref 6.5–8.1)

## 2018-06-11 LAB — URINALYSIS, COMPLETE (UACMP) WITH MICROSCOPIC
BACTERIA UA: NONE SEEN
Bilirubin Urine: NEGATIVE
Glucose, UA: NEGATIVE mg/dL
Ketones, ur: NEGATIVE mg/dL
Leukocytes, UA: NEGATIVE
Nitrite: NEGATIVE
PH: 7.5 (ref 5.0–8.0)
Protein, ur: NEGATIVE mg/dL
RBC / HPF: NONE SEEN RBC/hpf (ref 0–5)
Specific Gravity, Urine: 1.015 (ref 1.005–1.030)

## 2018-06-11 LAB — MAGNESIUM: Magnesium: 2 mg/dL (ref 1.7–2.4)

## 2018-06-11 LAB — GLUCOSE, CAPILLARY: Glucose-Capillary: 105 mg/dL — ABNORMAL HIGH (ref 70–99)

## 2018-06-11 NOTE — Discharge Instructions (Addendum)
Follow-up closely with your primary care this week.  You can try over-the-counter melatonin as needed.  Plenty of fluids.  Avoid caffeine and stimulants.  Return to urgent care as needed.  For any confusion, headache, worsening concerns proceed directly to the emergency room.

## 2018-06-11 NOTE — ED Triage Notes (Signed)
Per patient felt weak yesterday.Patient stated with chills this morning. Patient present with weakness.

## 2018-06-11 NOTE — ED Provider Notes (Addendum)
[name] ____________________________________________  Time seen: Approximately 11:06 AM  I have reviewed the triage vital signs and the nursing notes.   HISTORY  Chief Complaint Weakness   HPI Dana [name] is a 57 y.o. female past medical history of anxiety, hypertension  resolved per patient, seizures as child presenting with her husband for evaluation of generalized weakness.  States generalized weakness has been gradually uncommon over the last 3 to 4 days increased today.  States that same time.  She has not been able to sleep well.  States that she is under significant recent stress regarding her family including her father in and out of the hospital and she has been having to care for him.  States yesterday and last night she had the opportunity to sleep and rest, but states that she just could not go and stay to sleep.  Patient states that she did not sleep at all last night.  Denies any known trigger or stimulant.  States she is not experiencing any pain or discomfort.  Did have some chills yesterday but no fevers, cough, congestion, sore throat, dysuria, vaginal bleeding, blood in stool, abdominal pain.  Denies any unilateral weakness, vision changes, vertigo, unsteady gait, confusion, fall, trauma.  Patient has been reports that she was breathing heavier this morning, but states that that was when she was feeling more frustrated due to lack of sleep, denies any other breathing changes.  No over-the-counter medications taken for the same complaints.  Denies other aggravating factors. Denies chest pain, shortness of breath, abdominal pain, dysuria, extremity pain, extremity swelling or rash. Denies recent sickness. Denies recent antibiotic use. Reports has not ate yet today, but ate and drank well yesterday and other days.   [facility]: PCP   Past Medical History:  Diagnosis Date  . Anxiety   . Hypertension   . Seizures (HCC)    pseudo seizure history      Patient Active Problem List   Diagnosis Date Noted  . Myalgia 01/13/2016  . HYPERTENSION, BENIGN 12/29/2010  . FATIGUE / MALAISE 12/29/2010  . CHEST PAIN UNSPECIFIED 12/09/2010    Past Surgical History:  Procedure Laterality Date  . ABDOMINAL HYSTERECTOMY    . APPENDECTOMY    . Broken arm    . CHOLECYSTECTOMY       No current facility-administered medications for this encounter.  No current outpatient medications on file.  Allergies Patient has no known allergies.  Family History  Problem Relation Age of Onset  . Other Mother   . Healthy Father   Mother- cancer  Social History Social History   Tobacco Use  . Smoking status: Never Smoker  . Smokeless tobacco: Never Used  . Tobacco comment: tobacco use- no   Substance Use Topics  . Alcohol use: No    Alcohol/week: 0.0 oz  . Drug use: No    Review of Systems Constitutional: No fever/chills Eyes: No visual changes. ENT: No sore throat. Cardiovascular: Denies chest pain. Respiratory: Denies shortness of breath. Gastrointestinal: No abdominal pain.  No nausea, no vomiting.  No diarrhea.  No constipation. Genitourinary: Negative for dysuria. Musculoskeletal: Negative for back pain. Skin: Negative for rash. Neurological: Negative for headaches, focal weakness or numbness.   ____________________________________________   PHYSICAL EXAM:  VITAL SIGNS: ED Triage Vitals  Enc Vitals Group     BP 06/11/18 0945 (!) 141/73     Pulse Rate 06/11/18 0945 75     Resp 06/11/18 0945 16     Temp  06/11/18 0957 97.7 F (36.5 C)     Temp Source 06/11/18 0945 Oral     SpO2 06/11/18 0945 97 %     Weight 06/11/18 0949 175 lb (79.4 kg)     Height --      Head Circumference --      Peak Flow --      Pain Score 06/11/18 0946 0     Pain Loc --      Pain Edu? --      Excl. in GC? --    Orthostatic VS for the past 24 hrs:  BP- Lying Pulse- Lying BP- Sitting Pulse- Sitting BP- Standing at 0 minutes Pulse- Standing at  0 minutes  06/11/18 1129 (!) 158/98 78 (!) 154/93 79 (!) 143/107 103     Constitutional: Alert and oriented. Well appearing and in no acute distress. Eyes: Conjunctivae are normal. PERRL. EOMI. ENT      Head: Normocephalic and atraumatic.      Nose: No congestion      Mouth/Throat: Mucous membranes are moist.Oropharynx non-erythematous. Neck: No stridor. Supple without meningismus.  Hematological/Lymphatic/Immunilogical: No cervical lymphadenopathy. Cardiovascular: Normal rate, regular rhythm. Grossly normal heart sounds.  Good peripheral circulation. Respiratory: Normal respiratory effort without tachypnea nor retractions. Breath sounds are clear and equal bilaterally. No wheezes, rales, rhonchi. Gastrointestinal: Soft and nontender. No CVA tenderness. Musculoskeletal:  No midline cervical, thoracic or lumbar tenderness to palpation.  Neurologic:  Normal speech and language. No gross focal neurologic deficits are appreciated. Speech is normal. Negative pronator drift.  Skin:  Skin is warm, dry and intact. No rash noted. Psychiatric: Mood and affect are normal. Speech and behavior are normal. Patient exhibits appropriate insight and judgment   ___________________________________________   LABS (all labs ordered are listed, but only abnormal results are displayed)  Labs Reviewed  GLUCOSE, CAPILLARY - Abnormal; Notable for the following components:      Result Value   Glucose-Capillary 105 (*)    All other components within normal limits  COMPREHENSIVE METABOLIC PANEL - Abnormal; Notable for the following components:   Glucose, Bld 106 (*)    All other components within normal limits  URINALYSIS, COMPLETE (UACMP) WITH MICROSCOPIC - Abnormal; Notable for the following components:   Hgb urine dipstick TRACE (*)    All other components within normal limits  CBC WITH DIFFERENTIAL/PLATELET  MAGNESIUM  CBG MONITORING, ED   ____________________________________________  EKG ED ECG  REPORT I, Renford Dills, the attending provider, personally viewed and interpreted this ECG.   Date: 06/11/2018  EKG Time: 0955  Rate: 73  Rhythm:  normal sinus rhythm  Axis: normal  Intervals:none  ST&T Change: none Similar to previous 03/01/18  RADIOLOGY  No results found. ____________________________________________   PROCEDURES Procedures   INITIAL IMPRESSION / ASSESSMENT AND PLAN / ED COURSE  Pertinent labs & imaging results that were available during my care of the patient were reviewed by me and considered in my medical decision making (see chart for details).  Overall well-appearing patient.  Has been at bedside.  Patient alert and oriented.  Patient during exam without focal neurological deficits, however while continuing to talk to patient and husband, patient falling asleep during room.  Continue to monitor, lungs clear throughout, normal heart rate without respiratory distress.  Patient  reported that she did not sleep at all last night.  Stating unable to sleep and continues to think about things including her father and thinks that she needs to do.  Denies suicidal ideation.  Will evaluate CBC, CMP, magnesium.  EKG similar to previous without acute changes noted.  Suspect life stress leading to fatigue and sleep difficulty.   Patient continue to monitor in urgent care.  Patient reevaluated, and was sitting up eating peanut butter crackers and drinking.  Steady gait. No neurological deficits noted. Patient states that she is feeling much better after having rested some.  Discussed labs overall unremarkable.  Discussed evaluation of chest x-ray and CT, patient has been declined.  Husband also reports patient seeming to have improved.  Recommend rest, fluids, supportive care.  Discussed over-the-counter melatonin use.  Very strict follow-up with primary.  Discussed very strict follow-up and return parameters including for any worsening concerns, atypical behavior, confusion  proceed directly to emergency room. Discussed follow up with Primary care physician this week. Discussed follow up and return parameters including no resolution or any worsening concerns. Patient verbalized understanding and agreed to plan.   ____________________________________________   FINAL CLINICAL IMPRESSION(S) / ED DIAGNOSES  Final diagnoses:  Fatigue, unspecified type     ED Discharge Orders    None       Note: This dictation was prepared with Dragon dictation along with smaller phrase technology. Any transcriptional errors that result from this process are unintentional.        Renford Dills, NP 06/11/18 1238

## 2018-06-13 ENCOUNTER — Ambulatory Visit
Admission: RE | Admit: 2018-06-13 | Discharge: 2018-06-13 | Disposition: A | Discharge status: Home / Self Care | Payer: BC Managed Care – PPO | Source: Ambulatory Visit | Attending: Nurse Practitioner | Admitting: Nurse Practitioner

## 2018-06-13 DIAGNOSIS — Z1231 Encounter for screening mammogram for malignant neoplasm of breast: Secondary | ICD-10-CM | POA: Diagnosis not present

## 2018-08-14 ENCOUNTER — Other Ambulatory Visit: Payer: Self-pay | Admitting: Nurse Practitioner

## 2018-08-14 DIAGNOSIS — M7989 Other specified soft tissue disorders: Secondary | ICD-10-CM

## 2018-08-20 ENCOUNTER — Encounter (INDEPENDENT_AMBULATORY_CARE_PROVIDER_SITE_OTHER): Payer: Self-pay

## 2018-08-20 ENCOUNTER — Ambulatory Visit
Admission: RE | Admit: 2018-08-20 | Discharge: 2018-08-20 | Disposition: A | Discharge status: Home / Self Care | Payer: BC Managed Care – PPO | Source: Ambulatory Visit | Attending: Nurse Practitioner | Admitting: Nurse Practitioner

## 2018-08-20 DIAGNOSIS — M7989 Other specified soft tissue disorders: Secondary | ICD-10-CM | POA: Diagnosis not present

## 2018-08-20 DIAGNOSIS — E041 Nontoxic single thyroid nodule: Secondary | ICD-10-CM | POA: Diagnosis not present

## 2018-08-20 MED ORDER — IOHEXOL 300 MG/ML  SOLN
75.0000 mL | Freq: Once | INTRAMUSCULAR | Status: AC | PRN
  Administered 2018-08-20: 75 mL via INTRAVENOUS

## 2018-10-17 IMAGING — CT CT NECK W/ CM
2 of 3 series · 8 of 14 positions shown, 10 images · IV contrast (omnipaque)
Comparison: Cervical spine CT 09/30/2012

CLINICAL DATA: Palpable abnormality and pain with swallowing over
the last 2-3 months. Abnormal calcifications seen on outside
radiography.

EXAM:
CT NECK WITH CONTRAST
TECHNIQUE: Multidetector CT imaging of the neck was performed using the
standard protocol following the bolus administration of intravenous
contrast.
CONTRAST:  75mL OMNIPAQUE IOHEXOL 300 MG/ML  SOLN

[Series 2: axial neck · axial · 0.61mm/px · z∈[-279,-167]mm · 3 of 113 slices shown]
[im 29/113  bone]
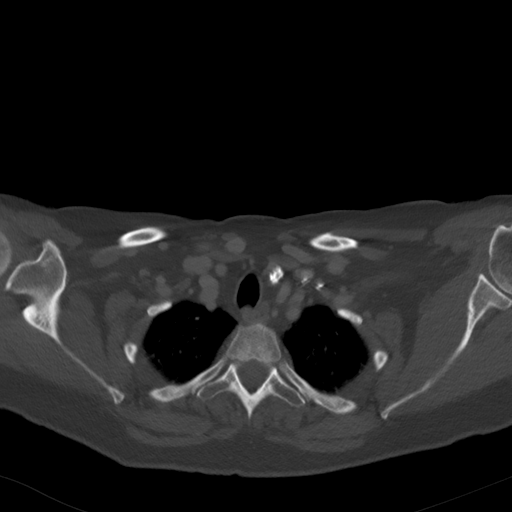
[im 57/113  bone]
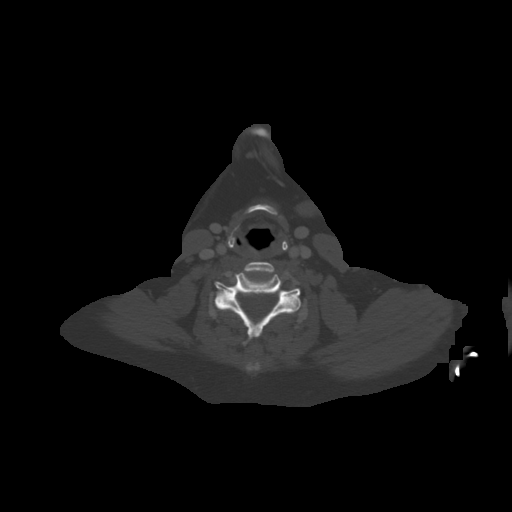
[im 85/113  bone]
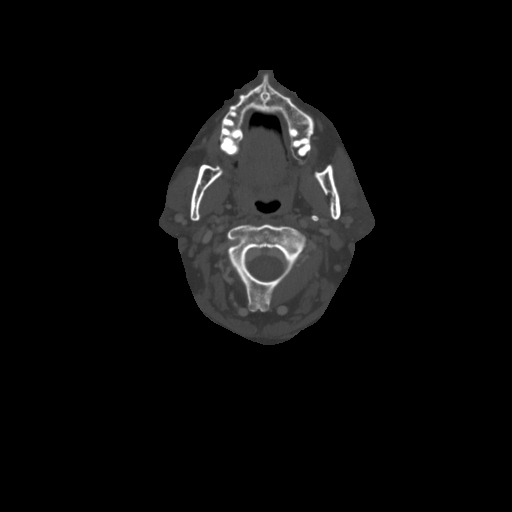

[Series 7: orthogonal ax · axial · 0.49mm/px · z∈[-345,-157]mm · 5 of 146 slices shown, 7 images]
[im 25/146  soft-tissue]
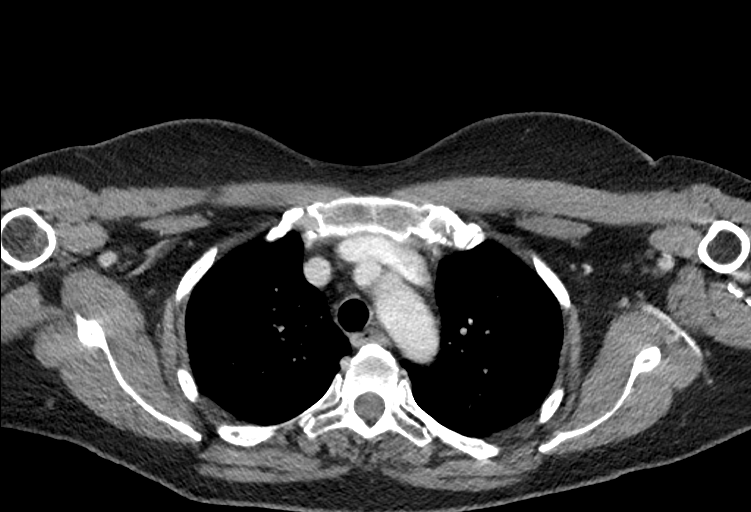
[im 25/146  bone]
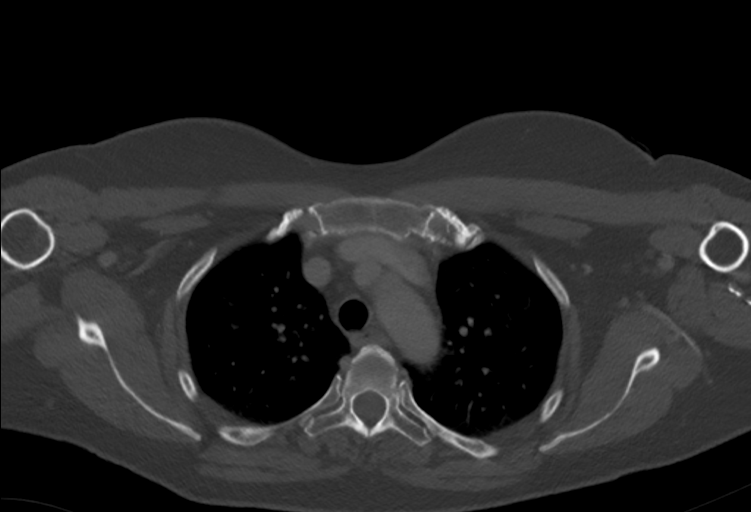
[im 49/146  bone]
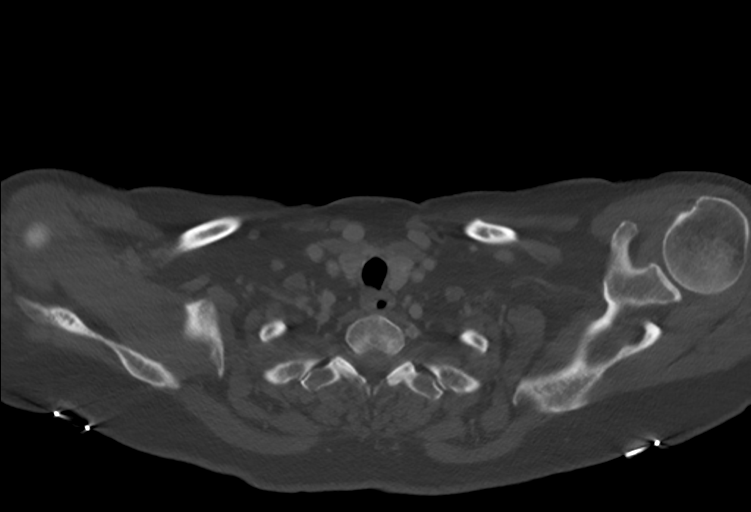
[im 73/146  bone]
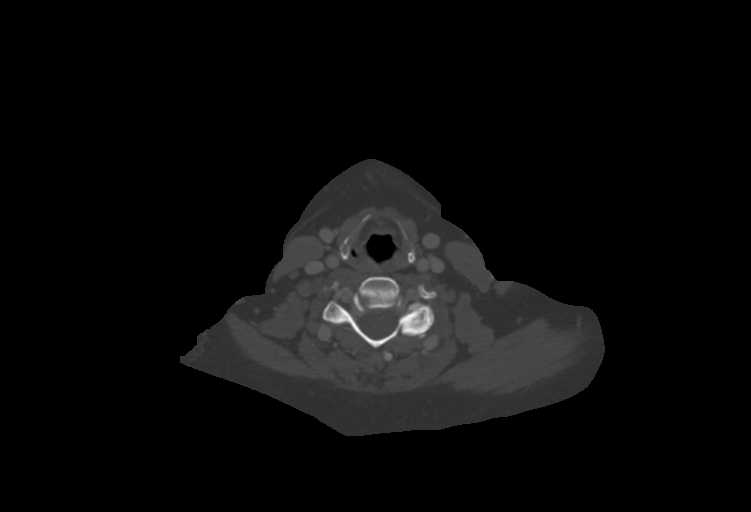
[im 97/146  bone]
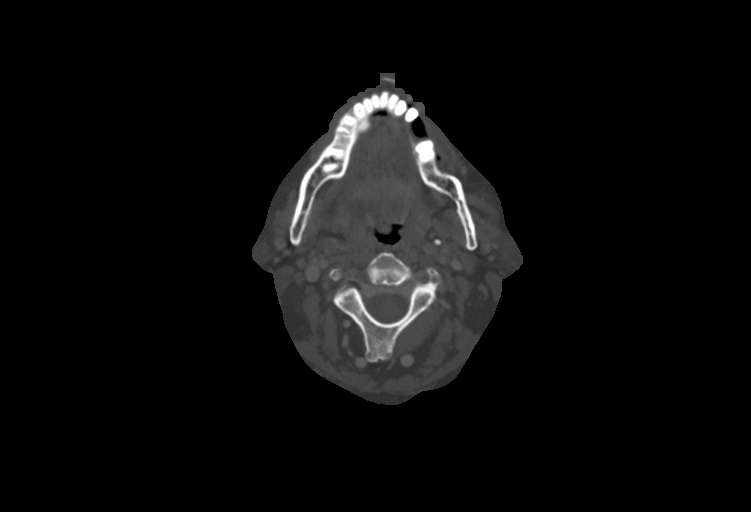
[im 121/146  soft-tissue]
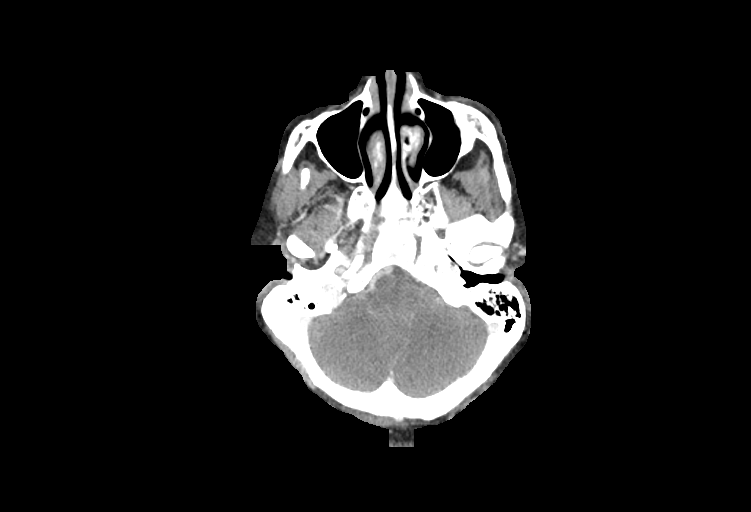
[im 121/146  bone]
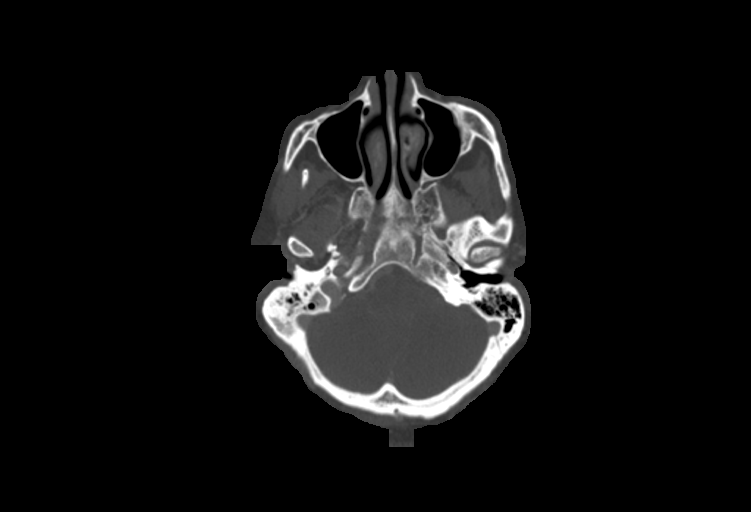

[8 of 14 positions shown; findings below may reference images not displayed]

FINDINGS: Pharynx and larynx: No mucosal or submucosal lesion.

Salivary glands: Parotid and submandibular glands are normal.

Thyroid: Chronic calcified nodule at the inferior aspect of the left
lobe of the thyroid, unchanged since 4314. No worrisome finding.

Lymph nodes: No enlarged or low-density nodes on either side of the
neck.

Vascular: Ordinary atherosclerotic calcification at the right
carotid bifurcation.

Limited intracranial: Normal

Visualized orbits: Normal

Mastoids and visualized paranasal sinuses: Clear

Skeleton: Normal

Upper chest: Normal except for a tiny calcified granuloma at the
right apex and mild pleural and parenchymal scarring at the apices.

Other: Skin marker put in place overlying the region of concern
which is in the midline of the neck the level of the thyroid
cartilage. Cartilage appears normal. There is no evidence of
regional abnormal calcification. There is no mass.
IMPRESSION: Negative examination. I do not see a lesion to correlate with the
clinical concern. Skin marker put in place in the region of concern
overlies the midline at the level of the thyroid cartilage. The
cartilage appears normal. No calcified soft tissue neck lesion seen
in the region.

Chronic calcified thyroid nodule inferior left lobe, unchanged since
4314. Could this be what was seen by radiography? This is lower, at
about the T1 level.

## 2019-05-15 ENCOUNTER — Other Ambulatory Visit: Payer: Self-pay | Admitting: Nurse Practitioner

## 2019-05-15 DIAGNOSIS — Z1231 Encounter for screening mammogram for malignant neoplasm of breast: Secondary | ICD-10-CM

## 2019-06-11 ENCOUNTER — Encounter: Payer: Self-pay | Admitting: Emergency Medicine

## 2019-06-11 ENCOUNTER — Other Ambulatory Visit: Payer: Self-pay

## 2019-06-11 ENCOUNTER — Ambulatory Visit
Admission: EM | Admit: 2019-06-11 | Discharge: 2019-06-11 | Disposition: A | Discharge status: Home / Self Care | Payer: BC Managed Care – PPO | Attending: Family Medicine | Admitting: Family Medicine

## 2019-06-11 DIAGNOSIS — R5383 Other fatigue: Secondary | ICD-10-CM

## 2019-06-11 DIAGNOSIS — R531 Weakness: Secondary | ICD-10-CM

## 2019-06-11 NOTE — ED Provider Notes (Signed)
[name]    CSN: [id number] Arrival date & time: 06/11/19  0802  History   Chief Complaint Chief Complaint  Patient presents with  . Shortness of Breath  . Weakness   HPI  58 year old female presents with multiple complaints.  Patient states that she has not been feeling well as of this morning.  Patient reports frontal and posterior headache, shortness of breath, and generalized weakness and fatigue.  Patient states she feels very tired.  She states that she slept well.  No fever.  She states that she feels short of breath but appears in no respiratory distress.  She reports that she is having difficulty moving about as she feels so weak.  No reported sick contacts.  No sick contacts at home.  Patient does note that she is under stress due to a relative moving into her house.  No known exacerbating or relieving factors.  No other complaints.  Of note, patient has prior history of similar visits to urgent care in the ER for these complaints.  Patient also has a history of pseudoseizure.  PMH, Surgical Hx, Family Hx, Social History reviewed and updated as below.  Past Medical History:  Diagnosis Date  . Anxiety   . Hypertension   . Seizures (HCC)    pseudo seizure history    Patient Active Problem List   Diagnosis Date Noted  . Myalgia 01/13/2016  . HYPERTENSION, BENIGN 12/29/2010  . FATIGUE / MALAISE 12/29/2010  . CHEST PAIN UNSPECIFIED 12/09/2010    Past Surgical History:  Procedure Laterality Date  . ABDOMINAL HYSTERECTOMY    . APPENDECTOMY    . Broken arm    . CHOLECYSTECTOMY      OB History   No obstetric history on file.      Home Medications    Prior to Admission medications   Not on File    Family History Family History  Problem Relation Age of Onset  . Other Mother   . Healthy Father     Social History Social History   Tobacco Use  . Smoking status: Never Smoker  . Smokeless tobacco: Never Used  . Tobacco comment: tobacco  use- no   Substance Use Topics  . Alcohol use: No    Alcohol/week: 0.0 standard drinks  . Drug use: No     Allergies   Patient has no known allergies.   Review of Systems Review of Systems  Constitutional: Positive for fatigue. Negative for fever.  Respiratory: Positive for shortness of breath.   Neurological: Positive for weakness.   Physical Exam Triage Vital Signs ED Triage Vitals  Enc Vitals Group     BP 06/11/19 0822 (!) 134/98     Pulse Rate 06/11/19 0822 80     Resp 06/11/19 0822 18     Temp 06/11/19 0822 97.8 F (36.6 C)     Temp Source 06/11/19 0822 Oral     SpO2 06/11/19 0822 100 %     Weight 06/11/19 0818 170 lb (77.1 kg)     Height 06/11/19 0818 5\' 5"  (1.651 m)     Head Circumference --      Peak Flow --      Pain Score 06/11/19 0818 7     Pain Loc --      Pain Edu? --      Excl. in GC? --    Updated Vital Signs BP (!) 134/98 (BP Location: Left Arm)   Pulse 80   Temp 97.8 F (  36.6 C) (Oral)   Resp 18   Ht 5\' 5"  (1.651 m)   Wt 77.1 kg   SpO2 100%   BMI 28.29 kg/m   Visual Acuity Right Eye Distance:   Left Eye Distance:   Bilateral Distance:    Right Eye Near:   Left Eye Near:    Bilateral Near:     Physical Exam Vitals signs and nursing note reviewed.  Constitutional:      Appearance: She is not ill-appearing.     Comments: Appears fatigued but in NAD.  HENT:     Head: Normocephalic and atraumatic.     Mouth/Throat:     Mouth: Mucous membranes are moist.     Pharynx: Oropharynx is clear. No posterior oropharyngeal erythema.  Eyes:     General:        Right eye: No discharge.        Left eye: No discharge.     Conjunctiva/sclera: Conjunctivae normal.  Cardiovascular:     Rate and Rhythm: Normal rate and regular rhythm.  Pulmonary:     Effort: Pulmonary effort is normal.     Breath sounds: No wheezing, rhonchi or rales.  Neurological:     General: No focal deficit present.     Mental Status: She is alert.  Psychiatric:         Behavior: Behavior normal.     Comments: Flat affect.    UC Treatments / Results  Labs (all labs ordered are listed, but only abnormal results are displayed) Labs Reviewed  NOVEL CORONAVIRUS, NAA (HOSPITAL ORDER, SEND-OUT TO REF LAB)    EKG   Radiology No results found.  Procedures Procedures (including critical care time)  Medications Ordered in UC Medications - No data to display  Initial Impression / Assessment and Plan / UC Course  I have reviewed the triage vital signs and the nursing notes.  Pertinent labs & imaging results that were available during my care of the patient were reviewed by me and considered in my medical decision making (see chart for details).    58 year old female presents with generalized weakness and fatigue.  She is in no acute distress.  No tachycardia.  Lungs clear. COVID test sent.  I feel that this is secondary to underlying stressors.  She has no physical exam findings to warrant further work-up or intervention.  Advised rest, fluids, Tylenol as needed.  Final Clinical Impressions(s) / UC Diagnoses   Final diagnoses:  Generalized weakness  Fatigue, unspecified type     Discharge Instructions     Go home and rest.  Be sure to eat something.  Lots of fluids.  Tylenol as needed.  If COVID testing returns positive you will get a phone call.  Take care  Dr. Lacinda Axon    ED Prescriptions    None     Controlled Substance Prescriptions [place] Controlled Substance Registry consulted? Not Applicable   Coral Spikes, [place] 06/11/19 [phone]

## 2019-06-11 NOTE — Discharge Instructions (Signed)
Go home and rest.  Be sure to eat something.  Lots of fluids.  Tylenol as needed.  If COVID testing returns positive you will get a phone call.  Take care  Dr. Lacinda Axon

## 2019-06-11 NOTE — ED Triage Notes (Signed)
Pt c/o headache, SOB and weakness. Started this morning. No know COVID exposure. Denies fever.

## 2019-06-13 ENCOUNTER — Encounter (HOSPITAL_COMMUNITY): Payer: Self-pay

## 2019-06-13 LAB — NOVEL CORONAVIRUS, NAA (HOSP ORDER, SEND-OUT TO REF LAB; TAT 18-24 HRS): SARS-CoV-2, NAA: NOT DETECTED

## 2019-06-17 ENCOUNTER — Ambulatory Visit
Admission: RE | Admit: 2019-06-17 | Discharge: 2019-06-17 | Disposition: A | Discharge status: Home / Self Care | Payer: BC Managed Care – PPO | Source: Ambulatory Visit | Attending: Nurse Practitioner | Admitting: Nurse Practitioner

## 2019-06-17 ENCOUNTER — Other Ambulatory Visit: Payer: Self-pay

## 2019-06-17 DIAGNOSIS — Z1231 Encounter for screening mammogram for malignant neoplasm of breast: Secondary | ICD-10-CM | POA: Insufficient documentation

## 2019-11-20 DIAGNOSIS — M542 Cervicalgia: Secondary | ICD-10-CM | POA: Insufficient documentation

## 2020-05-19 ENCOUNTER — Other Ambulatory Visit: Payer: Self-pay | Admitting: Nurse Practitioner

## 2020-05-28 ENCOUNTER — Other Ambulatory Visit: Payer: Self-pay | Admitting: Nurse Practitioner

## 2020-05-28 DIAGNOSIS — Z1231 Encounter for screening mammogram for malignant neoplasm of breast: Secondary | ICD-10-CM

## 2020-06-15 ENCOUNTER — Other Ambulatory Visit: Payer: Self-pay | Admitting: Physical Medicine & Rehabilitation

## 2020-06-15 DIAGNOSIS — M5412 Radiculopathy, cervical region: Secondary | ICD-10-CM

## 2020-06-30 ENCOUNTER — Ambulatory Visit
Admission: RE | Admit: 2020-06-30 | Discharge: 2020-06-30 | Disposition: A | Discharge status: Home / Self Care | Payer: BC Managed Care – PPO | Source: Ambulatory Visit | Attending: Physical Medicine & Rehabilitation | Admitting: Physical Medicine & Rehabilitation

## 2020-06-30 ENCOUNTER — Other Ambulatory Visit: Payer: Self-pay

## 2020-06-30 DIAGNOSIS — M5412 Radiculopathy, cervical region: Secondary | ICD-10-CM | POA: Insufficient documentation

## 2020-07-02 DIAGNOSIS — M4802 Spinal stenosis, cervical region: Secondary | ICD-10-CM | POA: Insufficient documentation

## 2020-07-03 ENCOUNTER — Other Ambulatory Visit: Payer: Self-pay | Admitting: Physical Medicine & Rehabilitation

## 2020-07-03 DIAGNOSIS — M542 Cervicalgia: Secondary | ICD-10-CM

## 2020-07-07 ENCOUNTER — Other Ambulatory Visit: Payer: Self-pay

## 2020-07-07 ENCOUNTER — Ambulatory Visit
Admission: RE | Admit: 2020-07-07 | Discharge: 2020-07-07 | Disposition: A | Discharge status: Home / Self Care | Payer: BC Managed Care – PPO | Source: Ambulatory Visit | Attending: Physical Medicine & Rehabilitation | Admitting: Physical Medicine & Rehabilitation

## 2020-07-07 DIAGNOSIS — M542 Cervicalgia: Secondary | ICD-10-CM

## 2020-07-07 MED ORDER — IOPAMIDOL (ISOVUE-M 300) INJECTION 61%
1.0000 mL | Freq: Once | INTRAMUSCULAR | Status: AC | PRN
  Administered 2020-07-07: 1 mL via EPIDURAL

## 2020-07-07 MED ORDER — TRIAMCINOLONE ACETONIDE 40 MG/ML IJ SUSP (RADIOLOGY)
60.0000 mg | Freq: Once | INTRAMUSCULAR | Status: AC
  Administered 2020-07-07: 60 mg via EPIDURAL

## 2020-07-07 NOTE — Discharge Instructions (Signed)

## 2020-07-22 ENCOUNTER — Ambulatory Visit: Payer: BC Managed Care – PPO

## 2020-08-05 ENCOUNTER — Other Ambulatory Visit: Payer: Self-pay

## 2020-08-05 ENCOUNTER — Ambulatory Visit
Admission: RE | Admit: 2020-08-05 | Discharge: 2020-08-05 | Disposition: A | Discharge status: Home / Self Care | Payer: BC Managed Care – PPO | Source: Ambulatory Visit | Attending: Nurse Practitioner | Admitting: Nurse Practitioner

## 2020-08-05 DIAGNOSIS — Z1231 Encounter for screening mammogram for malignant neoplasm of breast: Secondary | ICD-10-CM | POA: Insufficient documentation

## 2021-05-31 ENCOUNTER — Other Ambulatory Visit: Payer: Self-pay | Admitting: Nurse Practitioner

## 2021-05-31 DIAGNOSIS — Z1231 Encounter for screening mammogram for malignant neoplasm of breast: Secondary | ICD-10-CM

## 2021-08-09 ENCOUNTER — Ambulatory Visit
Admission: RE | Admit: 2021-08-09 | Discharge: 2021-08-09 | Disposition: A | Discharge status: Home / Self Care | Payer: BC Managed Care – PPO | Source: Ambulatory Visit | Attending: Nurse Practitioner | Admitting: Nurse Practitioner

## 2021-08-09 ENCOUNTER — Other Ambulatory Visit: Payer: Self-pay

## 2021-08-09 DIAGNOSIS — Z1231 Encounter for screening mammogram for malignant neoplasm of breast: Secondary | ICD-10-CM | POA: Insufficient documentation

## 2021-08-18 ENCOUNTER — Telehealth: Payer: BC Managed Care – PPO | Admitting: Family

## 2021-08-18 DIAGNOSIS — N750 Cyst of Bartholin's gland: Secondary | ICD-10-CM

## 2021-08-18 DIAGNOSIS — N898 Other specified noninflammatory disorders of vagina: Secondary | ICD-10-CM

## 2021-08-18 DIAGNOSIS — R350 Frequency of micturition: Secondary | ICD-10-CM

## 2021-08-18 NOTE — Progress Notes (Signed)
Based on what you shared with me, I feel your condition warrants further evaluation and I recommend that you be seen in a face to face visit.  Hello, It sounds like you have a bartholin cyst. However, with these you usually do not have urinary frequency, vaginal itching, and back pain. I recommend being seen in person for further evaluation.    NOTE: There will be NO CHARGE for this eVisit   If you are having a true medical emergency please call 911.      For an urgent face to face visit, [facility] has six urgent care centers for your convenience:     [facility] at [facility] Directions [phone] [address] [place], [place] [address]    [facility] Center [facility]) Get Driving Directions [phone] [address] [place], [place] [address]  [facility]) Get Driving Directions [phone] [address] [place],  [place]  [address]  [facility] at [facility] Get Driving Directions [phone] [address], [address] [place], [place] [address]   [facility] at [facility] Get Driving Directions  [phone] [address] [place], [place] [address]   [facility] Care at [facility] Directions [phone] [address], [address] [place], [place] [address]  Your MyChart E-visit questionnaire answers were reviewed by a board certified advanced clinical practitioner to complete your personal care plan based on your specific symptoms.  Thank you for using e-Visits.

## 2021-10-06 IMAGING — MG MM DIGITAL SCREENING BILAT W/ TOMO AND CAD
6 of 10 series · 6 of 30 positions shown · non-contrast
Comparison: Previous exam(s).

CLINICAL DATA: Screening.

EXAM:
DIGITAL SCREENING BILATERAL MAMMOGRAM WITH TOMOSYNTHESIS AND CAD
TECHNIQUE: Bilateral screening digital craniocaudal and mediolateral oblique
mammograms were obtained. Bilateral screening digital breast
tomosynthesis was performed. The images were evaluated with
computer-aided detection.

[R CC synth-2D]
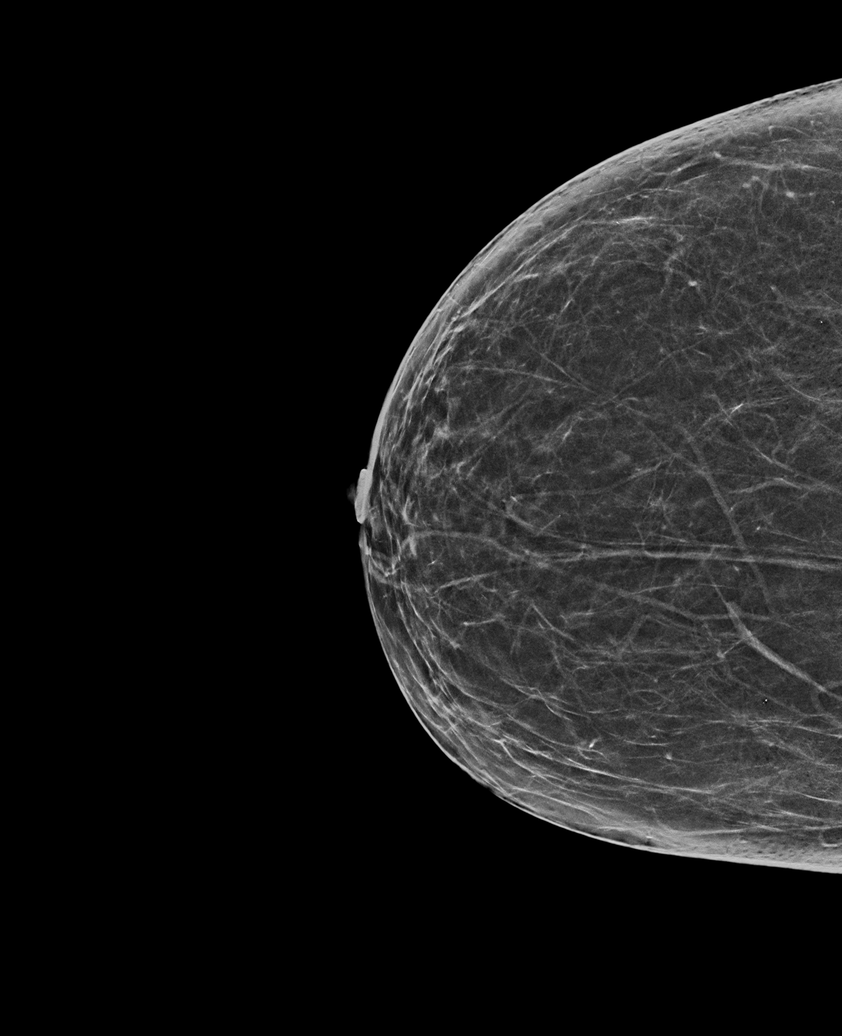

[R MLO synth-2D]
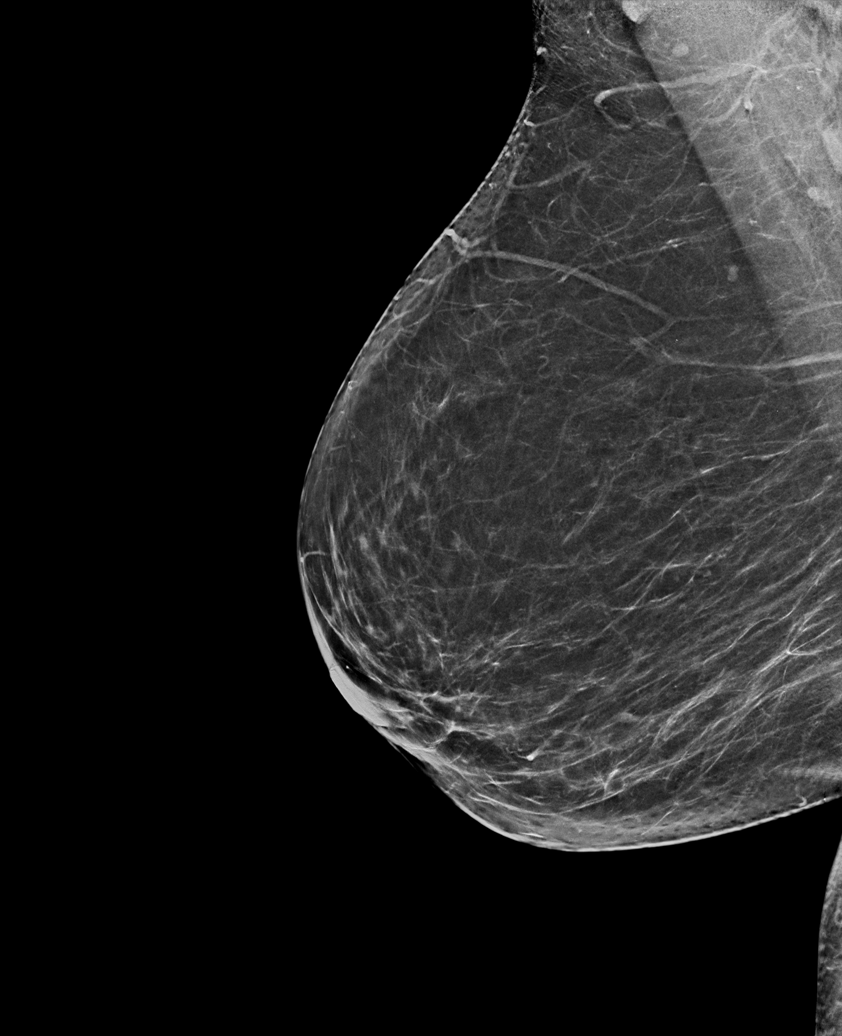

[L CC synth-2D (1 of 2)]
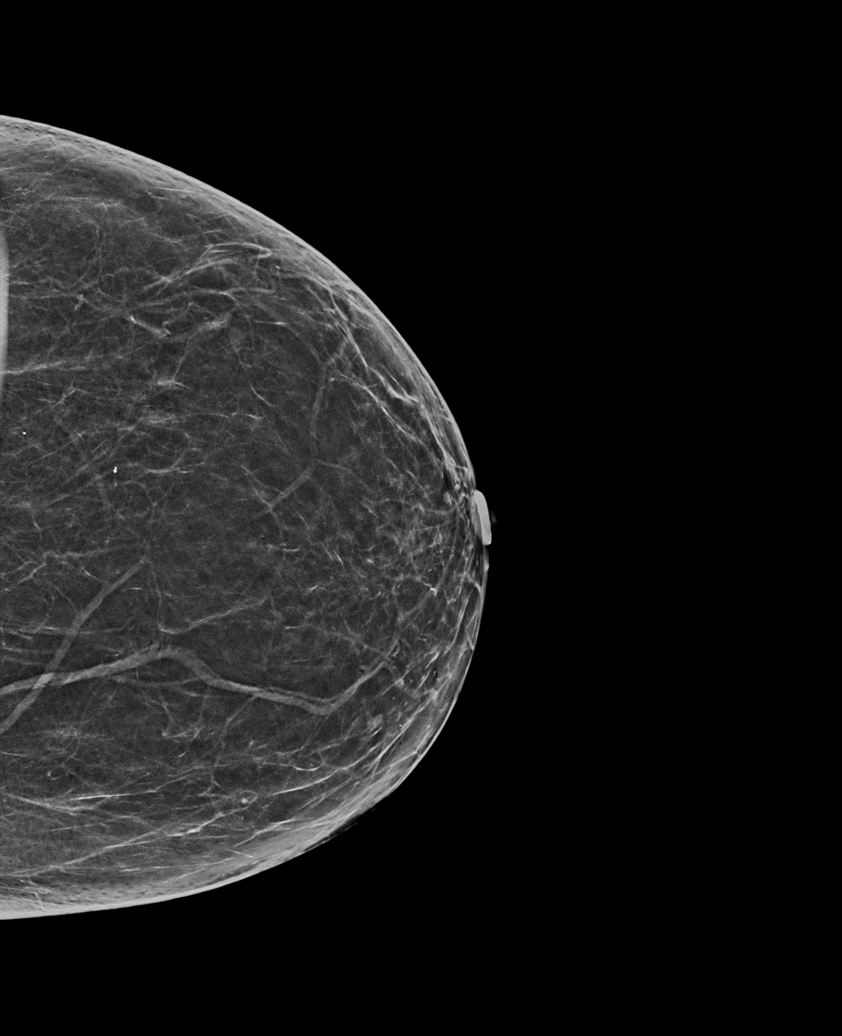

[L MLO synth-2D]
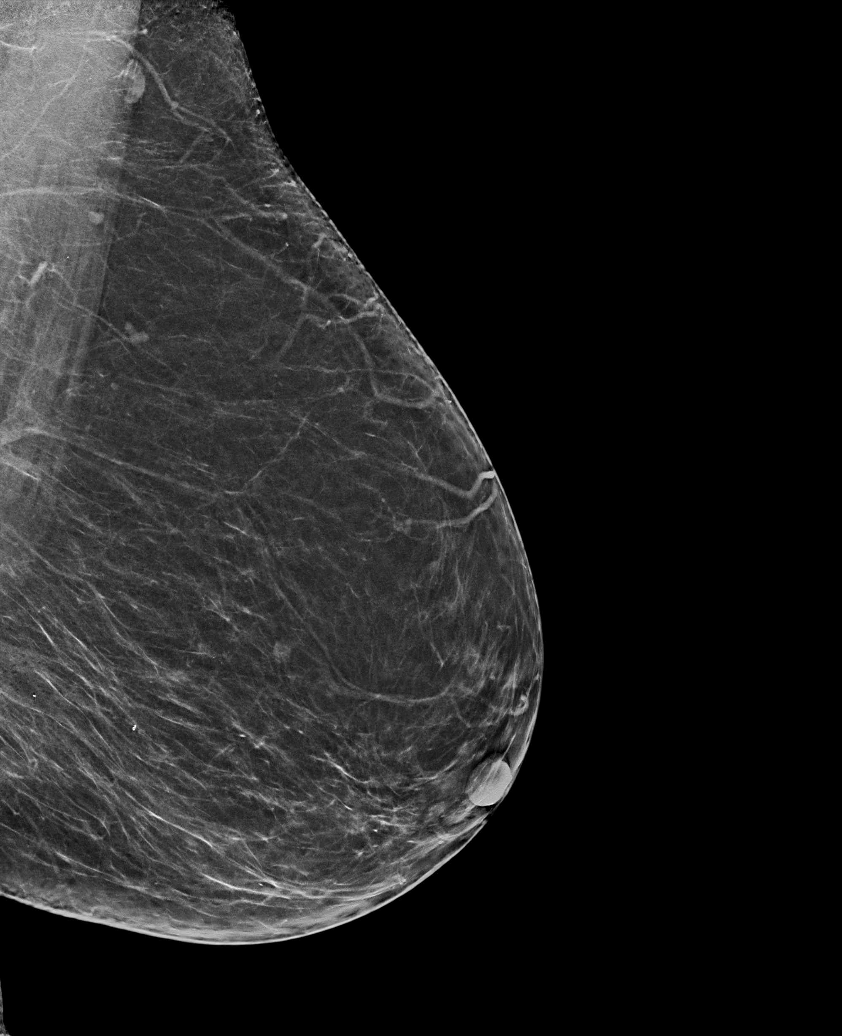

[L CC synth-2D (2 of 2)]
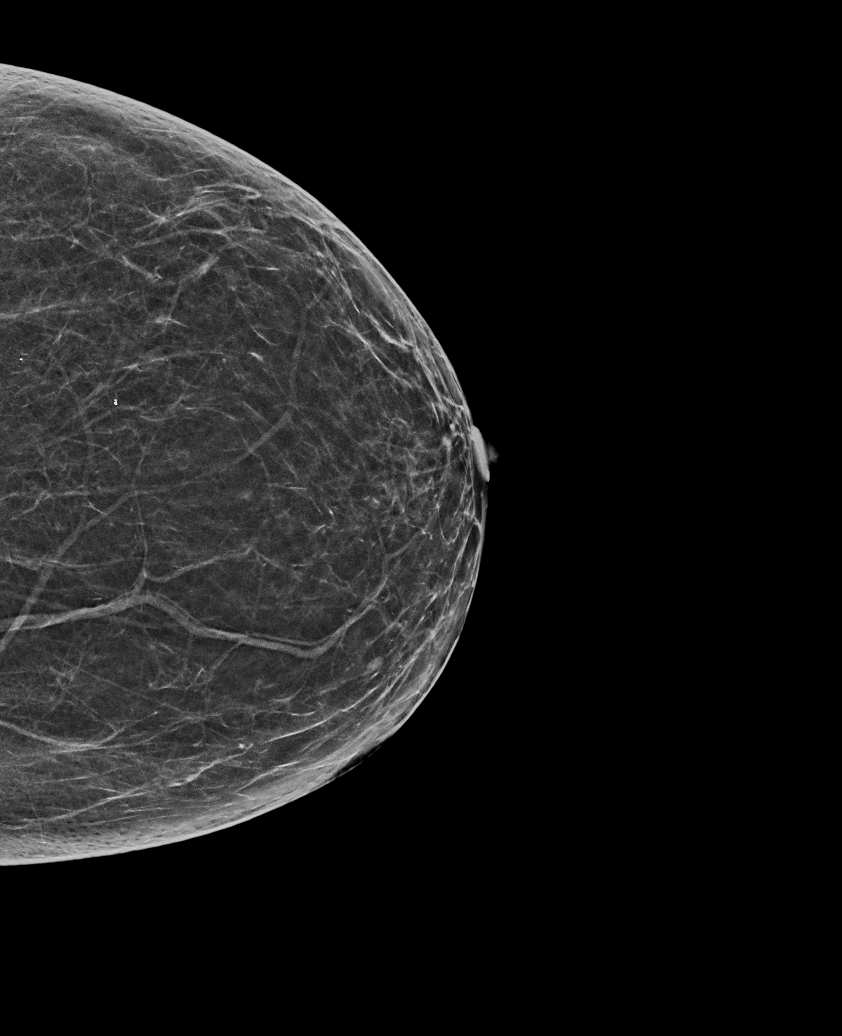

[L CC tomo · tomo slice 31/60.0]
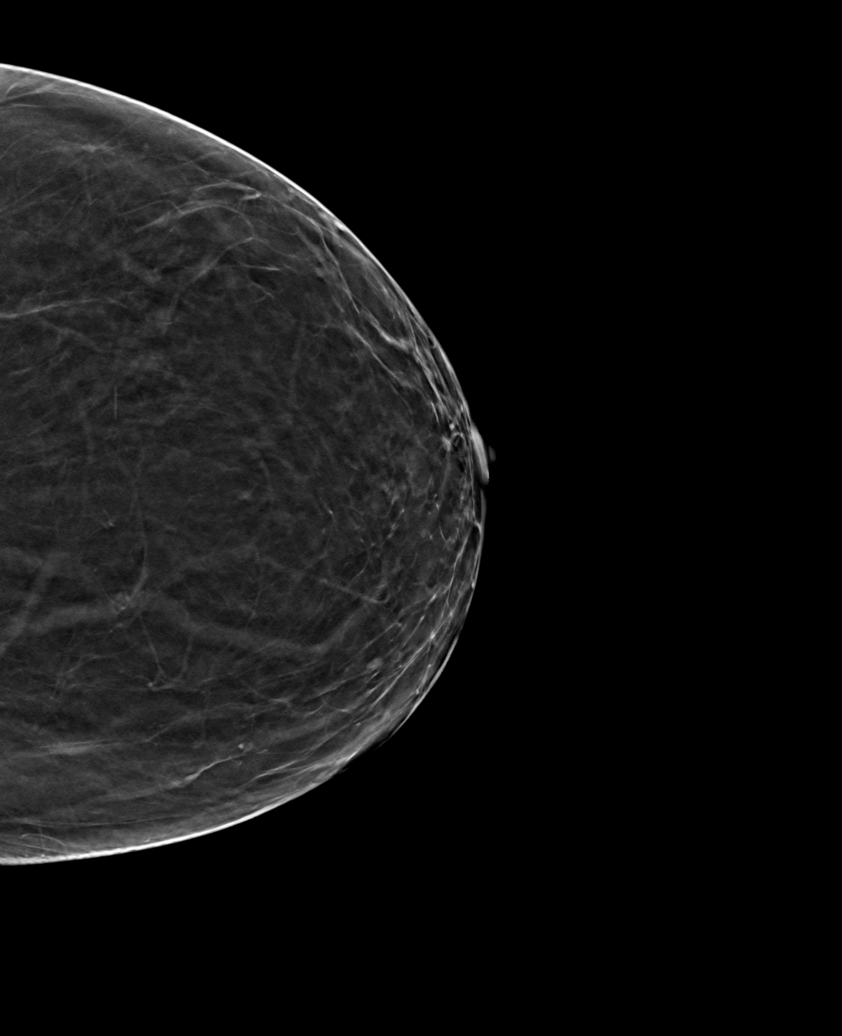

[6 of 30 positions shown; findings below may reference images not displayed]

ACR Breast Density Category b: There are scattered areas of
fibroglandular density.
FINDINGS: There are no findings suspicious for malignancy.
IMPRESSION: No mammographic evidence of malignancy. A result letter of this
screening mammogram will be mailed directly to the patient.

RECOMMENDATION:
Screening mammogram in one year. (Code:51-O-LD2)

BI-RADS CATEGORY  1: Negative.

## 2022-08-17 ENCOUNTER — Other Ambulatory Visit: Payer: Self-pay | Admitting: Nurse Practitioner

## 2022-08-17 DIAGNOSIS — Z1231 Encounter for screening mammogram for malignant neoplasm of breast: Secondary | ICD-10-CM

## 2022-09-06 ENCOUNTER — Ambulatory Visit
Admission: RE | Admit: 2022-09-06 | Discharge: 2022-09-06 | Disposition: A | Discharge status: Home / Self Care | Payer: BC Managed Care – PPO | Source: Ambulatory Visit | Attending: Nurse Practitioner | Admitting: Nurse Practitioner

## 2022-09-06 DIAGNOSIS — Z1231 Encounter for screening mammogram for malignant neoplasm of breast: Secondary | ICD-10-CM | POA: Diagnosis present

## 2023-01-30 DIAGNOSIS — F445 Conversion disorder with seizures or convulsions: Secondary | ICD-10-CM | POA: Insufficient documentation

## 2023-01-30 DIAGNOSIS — F419 Anxiety disorder, unspecified: Secondary | ICD-10-CM | POA: Insufficient documentation

## 2023-08-26 ENCOUNTER — Encounter: Payer: Self-pay | Admitting: Emergency Medicine

## 2023-08-26 ENCOUNTER — Ambulatory Visit
Admission: EM | Admit: 2023-08-26 | Discharge: 2023-08-26 | Disposition: A | Discharge status: Home / Self Care | Payer: BC Managed Care – PPO | Attending: Family Medicine | Admitting: Family Medicine

## 2023-08-26 DIAGNOSIS — I10 Essential (primary) hypertension: Secondary | ICD-10-CM | POA: Insufficient documentation

## 2023-08-26 DIAGNOSIS — J069 Acute upper respiratory infection, unspecified: Secondary | ICD-10-CM | POA: Insufficient documentation

## 2023-08-26 DIAGNOSIS — Z1152 Encounter for screening for COVID-19: Secondary | ICD-10-CM | POA: Insufficient documentation

## 2023-08-26 LAB — RESP PANEL BY RT-PCR (RSV, FLU A&B, COVID)  RVPGX2
Influenza A by PCR: NEGATIVE
Influenza B by PCR: NEGATIVE
Resp Syncytial Virus by PCR: NEGATIVE
SARS Coronavirus 2 by RT PCR: NEGATIVE

## 2023-08-26 MED ORDER — HYDROCOD POLI-CHLORPHE POLI ER 10-8 MG/5ML PO SUER: 5.0000 mL | Freq: Two times a day (BID) | ORAL | 0 refills | Status: DC | PRN

## 2023-08-26 NOTE — Discharge Instructions (Addendum)
We will contact you if your COVID, influenza or RSV test(s) is/are positive.  Please quarantine while you wait for the results.  If your test is negative you may resume normal activities.  If your test is positive, quarantine until you are without a fever for at least 24 hours without fever-lowering (Tylenol/Motrin) medications.    If your were prescribed medication, stop by the pharmacy to pick them up. You can take Tylenol and/or Ibuprofen as needed for fever reduction and pain relief.    For cough: honey 1/2 to 1 teaspoon (you can dilute the honey in water or another fluid).  You can also use guaifenesin and dextromethorphan for cough. You can use a humidifier for chest congestion and cough.  If you don't have a humidifier, you can sit in the bathroom with the hot shower running.      For sore throat: try warm salt water gargles, Mucinex sore throat cough drops or cepacol lozenges, throat spray, warm tea or water with lemon/honey, popsicles or ice, or OTC cold relief medicine for throat discomfort. You can also purchase chloraseptic spray at the pharmacy or dollar store.   For congestion: take a daily anti-histamine like Zyrtec, Claritin, and a oral decongestant, such as pseudoephedrine.  You can also use Flonase 1-2 sprays in each nostril daily. Afrin is also a good option, if you do not have high blood pressure.    It is important to stay hydrated: drink plenty of fluids (water, gatorade/powerade/pedialyte, juices, or teas) to keep your throat moisturized and help further relieve irritation/discomfort.    Return or go to the Emergency Department if symptoms worsen or do not improve in the next few days

## 2023-08-26 NOTE — ED Provider Notes (Signed)
[name]    CSN: [id number] Arrival date & time: 08/26/23  0803      History   Chief Complaint Chief Complaint  Patient presents with   Cough   Headache    HPI Dana [name] is a 62 y.o. female.   HPI  History obtained from the patient. Dana [name] presents for respiratory symptoms that started Thursday. Has cough, body aches, low-grade fever, watery eyes,rhinorrhea, nasal congestion, headache.  No vomiting, diarrhea.  Tried Nyquil and DayQuil which helped somewhat.  Cough is bothering her sleep.  She is a Midwife.  No asthma history and denies smoking.    Past Medical History:  Diagnosis Date   Anxiety    Hypertension    Seizures (HCC)    pseudo seizure history    Patient Active Problem List   Diagnosis Date Noted   Myalgia 01/13/2016   HYPERTENSION, BENIGN 12/29/2010   FATIGUE / MALAISE 12/29/2010   CHEST PAIN UNSPECIFIED 12/09/2010    Past Surgical History:  Procedure Laterality Date   ABDOMINAL HYSTERECTOMY     APPENDECTOMY     Broken arm     CHOLECYSTECTOMY      OB History   No obstetric history on file.      Home Medications    Prior to Admission medications   Medication Sig Start Date End Date Taking? Authorizing Provider  chlorpheniramine-HYDROcodone (TUSSIONEX) 10-8 MG/5ML Take 5 mLs by mouth every 12 (twelve) hours as needed for cough. 08/26/23  Yes Warner Laduca, DO  escitalopram (LEXAPRO) 10 MG tablet Take 1 tablet by mouth daily. 07/19/23  Yes [provider]  losartan (COZAAR) 25 MG tablet Take 1 tablet by mouth daily. 08/07/23  Yes [provider]  Multiple Vitamin (MULTIVITAMIN) capsule Take 1 capsule by mouth daily.    [provider]    Family History Family History  Problem Relation Age of Onset   Other Mother    Healthy Father    Breast cancer Paternal Grandmother     Social History Social History   Tobacco Use   Smoking status: Never   Smokeless tobacco: Never    Tobacco comments:    tobacco use- no   Vaping Use   Vaping status: Never Used  Substance Use Topics   Alcohol use: No    Alcohol/week: 0.0 standard drinks of alcohol   Drug use: No     Allergies   Patient has no known allergies.   Review of Systems Review of Systems: negative unless otherwise stated in HPI.      Physical Exam Triage Vital Signs ED Triage Vitals [08/26/23 0811]  Encounter Vitals Group     BP      Systolic BP Percentile      Diastolic BP Percentile      Pulse      Resp      Temp      Temp src      SpO2      Weight 175 lb (79.4 kg)     Height 5\' 5"  (1.651 m)     Head Circumference      Peak Flow      Pain Score 8     Pain Loc      Pain Education      Exclude from Growth Chart    No data found.  Updated Vital Signs BP (!) 174/99 (BP Location: Left Arm) Comment: Patient has not taken BP medicine today  Pulse (!) 112   Temp  99.1 F (37.3 C) (Oral)   Resp 14   Ht 5\' 5"  (1.651 m)   Wt 79.4 kg   SpO2 96%   BMI 29.12 kg/m   Visual Acuity Right Eye Distance:   Left Eye Distance:   Bilateral Distance:    Right Eye Near:   Left Eye Near:    Bilateral Near:     Physical Exam GEN:     alert, non-toxic appearing female in no distress    HENT:  mucus membranes moist, oropharyngeal without lesions or erythema, no tonsillar hypertrophy or exudates,  clear nasal discharge, bilateral TM normal EYES:   wears glasses, no scleral injection or discharge NECK:  normal ROM, no lymphadenopathy, no meningismus   RESP:  no increased work of breathing, clear to auscultation bilaterally CVS:   regular rate and rhythm Skin:   warm and dry    UC Treatments / Results  Labs (all labs ordered are listed, but only abnormal results are displayed) Labs Reviewed  RESP PANEL BY RT-PCR (RSV, FLU A&B, COVID)  [id number]    EKG   Radiology No results found.  Procedures Procedures (including critical care time)  Medications Ordered in UC Medications - No  data to display  Initial Impression / Assessment and Plan / UC Course  I have reviewed the triage vital signs and the nursing notes.  Pertinent labs & imaging results that were available during my care of the patient were reviewed by me and considered in my medical decision making (see chart for details).       Pt is a 62 y.o. female who presents for 2-3 days of respiratory symptoms. Dana [name] is afebrile here without recent antipyretics. Satting well on room air.  She is tachycardic.  She is hypertensive, BP 174/99.  She states she did not take her blood pressure medicines today.  Takes Losartan but is a low dose.  She may need medication adjustment however, she has been taken over-the-counter decongestants which may have increased her blood pressure as well.  Overall pt is ill but non-toxic appearing, well hydrated, without respiratory distress. Pulmonary exam is unremarkable.  RSV, influenza, COVID testing obtained. History consistent with viral respiratory illness. Discussed symptomatic treatment.  Explained lack of efficacy of antibiotics in viral disease.  Typical duration of symptoms discussed. Tussionex for cough to allow pt to rest.   Return and ED precautions given and voiced understanding. Discussed MDM, treatment plan and plan for follow-up with patient who agrees with plan.   COVID, influenza, RSV are negative.     Final Clinical Impressions(s) / UC Diagnoses   Final diagnoses:  URI with cough and congestion  Elevated blood pressure reading with diagnosis of hypertension     Discharge Instructions      We will contact you if your COVID, influenza or RSV test(s) is/are positive.  Please quarantine while you wait for the results.  If your test is negative you may resume normal activities.  If your test is positive, quarantine until you are without a fever for at least 24 hours without fever-lowering (Tylenol/Motrin) medications.    If your were prescribed medication, stop  by the pharmacy to pick them up. You can take Tylenol and/or Ibuprofen as needed for fever reduction and pain relief.    For cough: honey 1/2 to 1 teaspoon (you can dilute the honey in water or another fluid).  You can also use guaifenesin and dextromethorphan for cough. You can use a humidifier for chest congestion and cough.  If you don't have a humidifier, you can sit in the bathroom with the hot shower running.      For sore throat: try warm salt water gargles, Mucinex sore throat cough drops or cepacol lozenges, throat spray, warm tea or water with lemon/honey, popsicles or ice, or OTC cold relief medicine for throat discomfort. You can also purchase chloraseptic spray at the pharmacy or dollar store.   For congestion: take a daily anti-histamine like Zyrtec, Claritin, and a oral decongestant, such as pseudoephedrine.  You can also use Flonase 1-2 sprays in each nostril daily. Afrin is also a good option, if you do not have high blood pressure.    It is important to stay hydrated: drink plenty of fluids (water, gatorade/powerade/pedialyte, juices, or teas) to keep your throat moisturized and help further relieve irritation/discomfort.    Return or go to the Emergency Department if symptoms worsen or do not improve in the next few days      ED Prescriptions     Medication Sig Dispense Auth. Provider   chlorpheniramine-HYDROcodone (TUSSIONEX) 10-8 MG/5ML Take 5 mLs by mouth every 12 (twelve) hours as needed for cough. 115 mL Shalita Notte, Seward Meth, DO      I have reviewed the PDMP during this encounter.   Katha Cabal, DO 08/26/23 8295

## 2023-08-26 NOTE — ED Triage Notes (Signed)
Patient c/o cough, runny nose, nasal congestion, headache and bodyaches that started on Thursday.  Patient denies fevers.

## 2023-12-11 ENCOUNTER — Other Ambulatory Visit: Payer: Self-pay | Admitting: Nurse Practitioner

## 2023-12-11 DIAGNOSIS — Z1231 Encounter for screening mammogram for malignant neoplasm of breast: Secondary | ICD-10-CM

## 2023-12-26 ENCOUNTER — Ambulatory Visit: Payer: Self-pay

## 2024-02-08 ENCOUNTER — Ambulatory Visit
Admission: RE | Admit: 2024-02-08 | Discharge: 2024-02-08 | Disposition: A | Discharge status: Home / Self Care | Payer: Self-pay | Source: Ambulatory Visit | Attending: Nurse Practitioner | Admitting: Nurse Practitioner

## 2024-02-08 DIAGNOSIS — Z1231 Encounter for screening mammogram for malignant neoplasm of breast: Secondary | ICD-10-CM | POA: Diagnosis present

## 2024-02-26 ENCOUNTER — Ambulatory Visit
Admission: EM | Admit: 2024-02-26 | Discharge: 2024-02-26 | Disposition: A | Discharge status: Home / Self Care | Attending: Emergency Medicine | Admitting: Emergency Medicine

## 2024-02-26 DIAGNOSIS — M25551 Pain in right hip: Secondary | ICD-10-CM | POA: Diagnosis not present

## 2024-02-26 DIAGNOSIS — M79652 Pain in left thigh: Secondary | ICD-10-CM

## 2024-02-26 DIAGNOSIS — G8929 Other chronic pain: Secondary | ICD-10-CM | POA: Diagnosis not present

## 2024-02-26 MED ORDER — HYDROCODONE-ACETAMINOPHEN 5-325 MG PO TABS: 1.0000 | ORAL_TABLET | Freq: Four times a day (QID) | ORAL | 0 refills | Status: AC | PRN

## 2024-02-26 MED ORDER — NAPROXEN 500 MG PO TABS: 500.0000 mg | ORAL_TABLET | Freq: Two times a day (BID) | ORAL | 0 refills | Status: AC

## 2024-02-26 MED ORDER — KETOROLAC TROMETHAMINE 30 MG/ML IJ SOLN
30.0000 mg | Freq: Once | INTRAMUSCULAR | Status: AC
  Administered 2024-02-26: 30 mg via INTRAMUSCULAR

## 2024-02-26 MED ORDER — PREDNISONE 10 MG (21) PO TBPK: ORAL_TABLET | ORAL | 0 refills | Status: AC

## 2024-02-26 MED ORDER — ACETAMINOPHEN 325 MG PO TABS
975.0000 mg | ORAL_TABLET | Freq: Once | ORAL | Status: AC
  Administered 2024-02-26: 975 mg via ORAL

## 2024-02-26 MED ORDER — TIZANIDINE HCL 4 MG PO TABS: 4.0000 mg | ORAL_TABLET | Freq: Three times a day (TID) | ORAL | 0 refills | Status: AC | PRN

## 2024-02-26 MED ORDER — IBUPROFEN 600 MG PO TABS: 600.0000 mg | ORAL_TABLET | Freq: Four times a day (QID) | ORAL | 0 refills | Status: DC | PRN

## 2024-02-26 NOTE — Discharge Instructions (Addendum)
 I have given you Toradol and Tylenol here.  You may take your next dose of medication and 6 to 8 hours.  Take Naprosyn with a Tylenol containing product twice a day.  Either 1000 mg of plain Tylenol for mild to moderate pain or 1-2 Norco for severe pain.  You may take an additional dose of a Tylenol containing product 1 more time a day as we discussed.  Do not take both Tylenol and Norco, they both have Tylenol in them.  Take 1 or the other.  Do not exceed 4000 mg of Tylenol from all sources in 24 hours.  Try the Zanaflex and the prednisone.  Please follow-up with your vein specialist tomorrow and with orthopedics ASAP.  Go to the ER for chest pain, shortness of breath, sharp pain with breathing and, fevers above 100.4, redness in the surgical site, or for other concerns

## 2024-02-26 NOTE — ED Triage Notes (Signed)
 Pt c/o bilateral leg pain R ongoing for years & L d/t vein surgery. Is sch to see specialist in April to determine if surgery is needed or cleanouts. Hx of arthritis.

## 2024-02-26 NOTE — ED Provider Notes (Signed)
 HPI  SUBJECTIVE:  Dana [name] is a 63 y.o. female who presents with bilateral leg pain.  She has had right hip pain for the over the past year and a half that she describes as sharp, constant.  She states the pain has gotten sharper and is having difficulty sleeping at night secondary to the pain.  She has been getting cortisone injections for this from orthopedics, but has an appointment next week/later this month to see if she needs surgery.  She has been using a heating pad, taking ibuprofen 600 mg every 6 hours with temporary improvement.  Symptoms are worse with lying down, walking and sitting.  She has been doing a great deal of standing and walking recently.  Denies recent trauma/reinjury  Second, she reports acute left medial thigh pain after having varicose vein surgery 6 days ago.  She describes this pain as dull, becoming constant today.  She reports some erythema, but states that this is improving.  She reports localized edema.  No purulent drainage, fevers, calf pain or swelling, chest pain, shortness of breath.  She talk to the vascular surgeon who performed the surgery, and states that they will do an ultrasound on her tomorrow.  She has been wearing her compression shorts without improvement in her symptoms.  Symptoms worse with walking, sitting.  No antipyretic in the past 6 hours.  She has a past medical history of hypertension and right hip arthritis.  No history of chronic kidney disease, DVT, PE, diabetes.  [facility].  Orthopedics: [facility].    Past Medical History:  Diagnosis Date   Anxiety    Hypertension    Seizures (HCC)    pseudo seizure history    Past Surgical History:  Procedure Laterality Date   ABDOMINAL HYSTERECTOMY     APPENDECTOMY     Broken arm     CHOLECYSTECTOMY      Family History  Problem Relation Age of Onset   Other Mother    Healthy Father    Breast cancer Paternal Grandmother     Social History   Tobacco Use   Smoking  status: Never   Smokeless tobacco: Never   Tobacco comments:    tobacco use- no   Vaping Use   Vaping status: Never Used  Substance Use Topics   Alcohol use: No    Alcohol/week: 0.0 standard drinks of alcohol   Drug use: No    No current facility-administered medications for this encounter.  Current Outpatient Medications:    escitalopram (LEXAPRO) 10 MG tablet, Take 1 tablet by mouth daily., Disp: , Rfl:    HYDROcodone-acetaminophen (NORCO/VICODIN) 5-325 MG tablet, Take 1-2 tablets by mouth every 6 (six) hours as needed for moderate pain (pain score 4-6) or severe pain (pain score 7-10)., Disp: 12 tablet, Rfl: 0   ibuprofen (ADVIL) 600 MG tablet, Take 1 tablet (600 mg total) by mouth every 6 (six) hours as needed., Disp: 30 tablet, Rfl: 0   losartan (COZAAR) 25 MG tablet, Take 1 tablet by mouth daily., Disp: , Rfl:    Multiple Vitamin (MULTIVITAMIN) capsule, Take 1 capsule by mouth daily., Disp: , Rfl:    predniSONE (STERAPRED UNI-PAK 21 TAB) 10 MG (21) TBPK tablet, Dispense one 6 day pack. Take as directed with food., Disp: 21 tablet, Rfl: 0   tiZANidine (ZANAFLEX) 4 MG tablet, Take 1 tablet (4 mg total) by mouth every 8 (eight) hours as needed for muscle spasms., Disp: 30 tablet, Rfl: 0  Allergies  Allergen Reactions   Codeine Itching and Rash     ROS  As noted in HPI.   Physical Exam  BP 126/83 (BP Location: Left Arm)   Pulse 90   Temp 98.4 F (36.9 C) (Oral)   Resp 16   Ht 5\' 5"  (1.651 m)   Wt 78.9 kg   SpO2 98%   BMI 28.96 kg/m   Constitutional: Well developed, well nourished, no acute distress Eyes:  EOMI, conjunctiva normal bilaterally HENT: Normocephalic, atraumatic,mucus membranes moist Respiratory: Normal inspiratory effort Cardiovascular: Normal rate GI: nondistended skin: No rash, skin intact Musculoskeletal: no deformities Right hip: No signs of trauma. No bruising, erythema, rash. Diffuse muscular tenderness over gluteal muscles, down IT band.  No  tenderness over quadriceps. pain with passive abduction/adduction of leg, hip flexion. No pain with int/ext rotation hip.  tenderness at sciatic notch.  Flexion/extension knee WNL. Knee joint NT, stable. Motor strength flexion/ext hip 5/5. Sensation to LT intact. DP 2+ patient able to get up and down from the table independently.  Left thigh: Slight erythema, exquisite tenderness over medial upper thigh.  No induration, palpable cord, increased temperature.  No swelling distally.  No calf tenderness, edema.  DP 2+. Neurologic: Alert & oriented x 3, no focal neuro deficits Psychiatric: Speech and behavior appropriate   ED Course   Medications  acetaminophen (TYLENOL) tablet 975 mg (975 mg Oral Given 02/26/24 1730)  ketorolac (TORADOL) 30 MG/ML injection 30 mg (30 mg Intramuscular Given 02/26/24 1730)    No orders of the defined types were placed in this encounter.   No results found for this or any previous visit (from the past 24 hours). No results found.  ED Clinical Impression  1. Chronic right hip pain   2. Acute pain of left thigh      ED Assessment/Plan     Outside labs reviewed.  GFR in January 99.  [place] Narcotic database reviewed for this patient, and feel that the risk/benefit ratio today is favorable for proceeding with a prescription for controlled substance.  1 opiate prescription for cough syrup in 2024.  1.  Chronic right hip pain.  Deferring repeat imaging as there has been no repeat trauma/reinjury.  She has diffuse muscular tenderness, spasm.  Will give Toradol 30 mg IM and Tylenol 975 mg p.o. here.  Will send home with a 6-day prednisone taper, Zanaflex, Naprosyn/Tylenol containing product twice a day.  Regular Tylenol for mild to moderate pain, Norco for severe pain.  May take an additional dose of Tylenol containing product 1 more time per day.  She has follow-up with orthopedics next week, advised her to call and see if she can be seen sooner.  2.  Acute medial  thigh pain post vascular surgery.  There does not appear to be any evidence of infection.  Superficial thrombophlebitis, DVT, postoperative pain in the differential.  She denies chest pain, shortness of breath, she is not tachycardic nor hypoxic.  I am unable to appreciate any palpable cord in the area of surgery.  We unfortunately do not have ultrasound here at this time.  She states that she can be seen by vascular tomorrow to have an ultrasound done.  I feel that this is reasonable.  Pain management as above.  Discussed  MDM, treatment plan, and plan for follow-up with patient. Discussed sn/sx that should prompt return to the ED. patient agrees with plan.   Meds ordered this encounter  Medications   acetaminophen (TYLENOL) tablet 975 mg  ketorolac (TORADOL) 30 MG/ML injection 30 mg   HYDROcodone-acetaminophen (NORCO/VICODIN) 5-325 MG tablet    Sig: Take 1-2 tablets by mouth every 6 (six) hours as needed for moderate pain (pain score 4-6) or severe pain (pain score 7-10).    Dispense:  12 tablet    Refill:  0   ibuprofen (ADVIL) 600 MG tablet    Sig: Take 1 tablet (600 mg total) by mouth every 6 (six) hours as needed.    Dispense:  30 tablet    Refill:  0   tiZANidine (ZANAFLEX) 4 MG tablet    Sig: Take 1 tablet (4 mg total) by mouth every 8 (eight) hours as needed for muscle spasms.    Dispense:  30 tablet    Refill:  0   predniSONE (STERAPRED UNI-PAK 21 TAB) 10 MG (21) TBPK tablet    Sig: Dispense one 6 day pack. Take as directed with food.    Dispense:  21 tablet    Refill:  0      *This clinic note was created using Scientist, clinical (histocompatibility and immunogenetics). Therefore, there may be occasional mistakes despite careful proofreading.  ?    Domenick Gong, MD 02/26/24 1801

## 2024-07-03 ENCOUNTER — Other Ambulatory Visit: Payer: Self-pay | Admitting: Emergency Medicine

## 2024-07-03 DIAGNOSIS — R0602 Shortness of breath: Secondary | ICD-10-CM

## 2024-07-03 DIAGNOSIS — B348 Other viral infections of unspecified site: Secondary | ICD-10-CM

## 2024-07-03 DIAGNOSIS — T17500A Unspecified foreign body in bronchus causing asphyxiation, initial encounter: Secondary | ICD-10-CM

## 2024-07-03 DIAGNOSIS — B9789 Other viral agents as the cause of diseases classified elsewhere: Secondary | ICD-10-CM

## 2024-07-03 DIAGNOSIS — R053 Chronic cough: Secondary | ICD-10-CM
